# Patient Record
Sex: Female | Born: 1935 | Race: White | Hispanic: No | Marital: Married | Smoking: Never smoker
Health system: Southern US, Community
[De-identification: ages and names within clinical notes are randomized; demographics above are authoritative.]

## PROBLEM LIST (undated history)

## (undated) DIAGNOSIS — R0602 Shortness of breath: Secondary | ICD-10-CM

## (undated) DIAGNOSIS — I493 Ventricular premature depolarization: Secondary | ICD-10-CM

## (undated) DIAGNOSIS — E039 Hypothyroidism, unspecified: Secondary | ICD-10-CM

## (undated) DIAGNOSIS — J449 Chronic obstructive pulmonary disease, unspecified: Secondary | ICD-10-CM

## (undated) DIAGNOSIS — E785 Hyperlipidemia, unspecified: Secondary | ICD-10-CM

## (undated) DIAGNOSIS — Z950 Presence of cardiac pacemaker: Secondary | ICD-10-CM

## (undated) DIAGNOSIS — G8929 Other chronic pain: Secondary | ICD-10-CM

## (undated) DIAGNOSIS — J189 Pneumonia, unspecified organism: Secondary | ICD-10-CM

## (undated) DIAGNOSIS — I441 Atrioventricular block, second degree: Secondary | ICD-10-CM

## (undated) DIAGNOSIS — M1990 Unspecified osteoarthritis, unspecified site: Secondary | ICD-10-CM

## (undated) DIAGNOSIS — Q169 Congenital malformation of ear causing impairment of hearing, unspecified: Secondary | ICD-10-CM

## (undated) DIAGNOSIS — G629 Polyneuropathy, unspecified: Secondary | ICD-10-CM

## (undated) DIAGNOSIS — K469 Unspecified abdominal hernia without obstruction or gangrene: Secondary | ICD-10-CM

## (undated) DIAGNOSIS — M545 Low back pain, unspecified: Secondary | ICD-10-CM

## (undated) DIAGNOSIS — I5042 Chronic combined systolic (congestive) and diastolic (congestive) heart failure: Secondary | ICD-10-CM

## (undated) DIAGNOSIS — I10 Essential (primary) hypertension: Secondary | ICD-10-CM

## (undated) DIAGNOSIS — L97509 Non-pressure chronic ulcer of other part of unspecified foot with unspecified severity: Secondary | ICD-10-CM

## (undated) HISTORY — DX: Ventricular premature depolarization: I49.3

## (undated) HISTORY — PX: REPLACEMENT TOTAL KNEE: SUR1224

## (undated) HISTORY — DX: Unspecified osteoarthritis, unspecified site: M19.90

## (undated) HISTORY — PX: THYROID SURGERY: SHX805

## (undated) HISTORY — DX: Hyperlipidemia, unspecified: E78.5

## (undated) HISTORY — DX: Congenital malformation of ear causing impairment of hearing, unspecified: Q16.9

## (undated) HISTORY — PX: NECK SURGERY: SHX720

## (undated) HISTORY — DX: Hypothyroidism, unspecified: E03.9

## (undated) HISTORY — PX: CATARACT EXTRACTION W/ INTRAOCULAR LENS  IMPLANT, BILATERAL: SHX1307

## (undated) HISTORY — DX: Unspecified abdominal hernia without obstruction or gangrene: K46.9

## (undated) HISTORY — PX: ABDOMINAL HYSTERECTOMY: SHX81

## (undated) HISTORY — DX: Polyneuropathy, unspecified: G62.9

## (undated) HISTORY — PX: JOINT REPLACEMENT: SHX530

## (undated) HISTORY — DX: Essential (primary) hypertension: I10

## (undated) HISTORY — DX: Chronic combined systolic (congestive) and diastolic (congestive) heart failure: I50.42

## (undated) HISTORY — PX: DILATION AND CURETTAGE OF UTERUS: SHX78

## (undated) HISTORY — DX: Atrioventricular block, second degree: I44.1

## (undated) HISTORY — DX: Non-pressure chronic ulcer of other part of unspecified foot with unspecified severity: L97.509

## (undated) HISTORY — DX: Presence of cardiac pacemaker: Z95.0

---
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
## 1997-07-31 ENCOUNTER — Encounter: Payer: Self-pay | Admitting: Family Medicine

## 2002-07-07 ENCOUNTER — Encounter: Payer: Self-pay | Admitting: Family Medicine

## 2003-07-17 ENCOUNTER — Encounter: Payer: Self-pay | Admitting: Family Medicine

## 2004-08-29 ENCOUNTER — Encounter: Payer: Self-pay | Admitting: Family Medicine

## 2005-07-18 ENCOUNTER — Encounter: Payer: Self-pay | Admitting: Family Medicine

## 2006-02-12 ENCOUNTER — Ambulatory Visit: Payer: Self-pay | Admitting: Pain Medicine

## 2006-03-21 ENCOUNTER — Ambulatory Visit: Payer: Self-pay | Admitting: Family Medicine

## 2006-04-04 ENCOUNTER — Ambulatory Visit: Payer: Self-pay | Admitting: Family Medicine

## 2006-04-17 HISTORY — PX: DOPPLER ECHOCARDIOGRAPHY: SHX263

## 2006-06-20 ENCOUNTER — Ambulatory Visit: Payer: Self-pay | Admitting: Family Medicine

## 2006-06-20 LAB — CONVERTED CEMR LAB
ALT: 16 units/L (ref 0–40)
Albumin: 4 g/dL (ref 3.5–5.2)
Alkaline Phosphatase: 57 units/L (ref 39–117)
BUN: 10 mg/dL (ref 6–23)
Calcium: 9.4 mg/dL (ref 8.4–10.5)
GFR calc Af Amer: 103 mL/min
GFR calc non Af Amer: 85 mL/min
Potassium: 4.8 meq/L (ref 3.5–5.1)
Total Protein: 7.1 g/dL (ref 6.0–8.3)

## 2006-07-12 ENCOUNTER — Ambulatory Visit: Payer: Self-pay | Admitting: Family Medicine

## 2006-07-26 ENCOUNTER — Ambulatory Visit: Payer: Self-pay | Admitting: Family Medicine

## 2006-08-31 ENCOUNTER — Encounter: Payer: Self-pay | Admitting: Family Medicine

## 2006-08-31 ENCOUNTER — Ambulatory Visit: Payer: Self-pay | Admitting: Internal Medicine

## 2006-09-03 ENCOUNTER — Ambulatory Visit: Payer: Self-pay | Admitting: Family Medicine

## 2006-09-03 DIAGNOSIS — E039 Hypothyroidism, unspecified: Secondary | ICD-10-CM | POA: Insufficient documentation

## 2006-09-04 LAB — CONVERTED CEMR LAB: TSH: 1.27 microintl units/mL (ref 0.35–5.50)

## 2006-09-07 ENCOUNTER — Encounter: Payer: Self-pay | Admitting: Family Medicine

## 2006-09-17 ENCOUNTER — Ambulatory Visit: Payer: Self-pay | Admitting: Family Medicine

## 2006-09-17 ENCOUNTER — Telehealth: Payer: Self-pay | Admitting: Family Medicine

## 2006-09-17 DIAGNOSIS — J309 Allergic rhinitis, unspecified: Secondary | ICD-10-CM | POA: Insufficient documentation

## 2006-09-17 DIAGNOSIS — G609 Hereditary and idiopathic neuropathy, unspecified: Secondary | ICD-10-CM | POA: Insufficient documentation

## 2006-09-17 DIAGNOSIS — J45909 Unspecified asthma, uncomplicated: Secondary | ICD-10-CM | POA: Insufficient documentation

## 2006-09-25 ENCOUNTER — Telehealth (INDEPENDENT_AMBULATORY_CARE_PROVIDER_SITE_OTHER): Payer: Self-pay | Admitting: *Deleted

## 2006-10-02 ENCOUNTER — Telehealth (INDEPENDENT_AMBULATORY_CARE_PROVIDER_SITE_OTHER): Payer: Self-pay | Admitting: *Deleted

## 2006-12-03 ENCOUNTER — Telehealth: Payer: Self-pay | Admitting: Family Medicine

## 2007-01-25 ENCOUNTER — Ambulatory Visit: Payer: Self-pay | Admitting: Family Medicine

## 2007-01-29 ENCOUNTER — Encounter: Payer: Self-pay | Admitting: Family Medicine

## 2007-01-29 DIAGNOSIS — M1990 Unspecified osteoarthritis, unspecified site: Secondary | ICD-10-CM | POA: Insufficient documentation

## 2007-01-29 DIAGNOSIS — I10 Essential (primary) hypertension: Secondary | ICD-10-CM | POA: Insufficient documentation

## 2007-01-29 DIAGNOSIS — E785 Hyperlipidemia, unspecified: Secondary | ICD-10-CM

## 2007-01-30 ENCOUNTER — Ambulatory Visit: Payer: Self-pay | Admitting: Family Medicine

## 2007-01-30 DIAGNOSIS — I872 Venous insufficiency (chronic) (peripheral): Secondary | ICD-10-CM | POA: Insufficient documentation

## 2007-01-30 DIAGNOSIS — I8310 Varicose veins of unspecified lower extremity with inflammation: Secondary | ICD-10-CM

## 2007-02-06 ENCOUNTER — Ambulatory Visit: Payer: Self-pay | Admitting: Family Medicine

## 2007-02-07 LAB — CONVERTED CEMR LAB
Cholesterol: 120 mg/dL (ref 0–200)
HDL: 65.9 mg/dL (ref >39.0)
Total CHOL/HDL Ratio: 1.8
Triglycerides: 73 mg/dL (ref 0–149)

## 2007-02-14 ENCOUNTER — Telehealth: Payer: Self-pay | Admitting: Family Medicine

## 2007-02-15 ENCOUNTER — Encounter: Payer: Self-pay | Admitting: Family Medicine

## 2007-02-20 ENCOUNTER — Encounter (INDEPENDENT_AMBULATORY_CARE_PROVIDER_SITE_OTHER): Payer: Self-pay | Admitting: *Deleted

## 2007-02-20 ENCOUNTER — Encounter: Payer: Self-pay | Admitting: Family Medicine

## 2007-02-20 ENCOUNTER — Telehealth: Payer: Self-pay | Admitting: Family Medicine

## 2007-03-05 ENCOUNTER — Ambulatory Visit: Payer: Self-pay | Admitting: Family Medicine

## 2007-03-06 ENCOUNTER — Encounter (INDEPENDENT_AMBULATORY_CARE_PROVIDER_SITE_OTHER): Payer: Self-pay | Admitting: *Deleted

## 2007-04-02 ENCOUNTER — Telehealth: Payer: Self-pay | Admitting: Family Medicine

## 2007-04-26 ENCOUNTER — Ambulatory Visit: Payer: Self-pay | Admitting: Family Medicine

## 2007-05-01 ENCOUNTER — Ambulatory Visit: Payer: Self-pay

## 2007-05-01 ENCOUNTER — Encounter: Payer: Self-pay | Admitting: Family Medicine

## 2007-05-03 ENCOUNTER — Ambulatory Visit: Payer: Self-pay | Admitting: Internal Medicine

## 2007-05-09 ENCOUNTER — Ambulatory Visit: Payer: Self-pay | Admitting: Internal Medicine

## 2007-05-09 LAB — CONVERTED CEMR LAB
BUN: 17 mg/dL (ref 6–23)
CO2: 23 meq/L (ref 19–32)
Calcium: 10 mg/dL (ref 8.4–10.5)
Glucose, Bld: 99 mg/dL (ref 70–99)
Sodium: 135 meq/L (ref 135–145)

## 2007-05-14 ENCOUNTER — Ambulatory Visit: Payer: Self-pay | Admitting: Cardiology

## 2007-05-14 LAB — CONVERTED CEMR LAB
BUN: 18 mg/dL (ref 6–23)
CO2: 25 meq/L (ref 19–32)
Calcium: 9.6 mg/dL (ref 8.4–10.5)
Chloride: 98 meq/L (ref 96–112)
Creatinine, Ser: 0.86 mg/dL (ref 0.40–1.20)

## 2007-05-20 ENCOUNTER — Telehealth: Payer: Self-pay | Admitting: Family Medicine

## 2007-05-27 ENCOUNTER — Ambulatory Visit: Payer: Self-pay | Admitting: Internal Medicine

## 2007-05-27 LAB — CONVERTED CEMR LAB
BUN: 14 mg/dL (ref 6–23)
Calcium: 9.7 mg/dL (ref 8.4–10.5)
Glucose, Bld: 97 mg/dL (ref 70–99)
Pro B Natriuretic peptide (BNP): 638 pg/mL — ABNORMAL HIGH (ref 0.0–100.0)
Sodium: 136 meq/L (ref 135–145)

## 2007-06-20 ENCOUNTER — Telehealth: Payer: Self-pay | Admitting: Family Medicine

## 2007-06-27 ENCOUNTER — Ambulatory Visit: Payer: Self-pay | Admitting: Internal Medicine

## 2007-06-27 LAB — CONVERTED CEMR LAB
Calcium: 9.6 mg/dL (ref 8.4–10.5)
Creatinine, Ser: 1 mg/dL (ref 0.40–1.20)

## 2007-07-11 ENCOUNTER — Telehealth: Payer: Self-pay | Admitting: Family Medicine

## 2007-07-23 ENCOUNTER — Encounter: Payer: Self-pay | Admitting: Family Medicine

## 2007-07-30 ENCOUNTER — Telehealth: Payer: Self-pay | Admitting: Family Medicine

## 2007-07-31 ENCOUNTER — Telehealth: Payer: Self-pay | Admitting: Family Medicine

## 2007-08-07 ENCOUNTER — Telehealth: Payer: Self-pay | Admitting: Family Medicine

## 2007-08-21 ENCOUNTER — Ambulatory Visit: Payer: Self-pay | Admitting: Internal Medicine

## 2007-08-21 LAB — CONVERTED CEMR LAB
Magnesium: 2 mg/dL (ref 1.5–2.5)
Potassium: 4.5 meq/L (ref 3.5–5.3)
Pro B Natriuretic peptide (BNP): 695 pg/mL — ABNORMAL HIGH (ref 0.0–100.0)
Sodium: 138 meq/L (ref 135–145)
TSH: 0.407 microintl units/mL (ref 0.350–5.50)

## 2007-08-26 ENCOUNTER — Ambulatory Visit: Payer: Self-pay | Admitting: Cardiology

## 2007-09-23 ENCOUNTER — Ambulatory Visit: Payer: Self-pay | Admitting: Internal Medicine

## 2007-09-24 ENCOUNTER — Encounter: Payer: Self-pay | Admitting: Internal Medicine

## 2007-09-24 LAB — CONVERTED CEMR LAB
BUN: 16 mg/dL (ref 6–23)
INR: 1.1 (ref 0.0–1.5)
Potassium: 4.5 meq/L (ref 3.5–5.3)
Prothrombin Time: 14.4 s (ref 11.6–15.2)
RBC: 4.21 M/uL (ref 3.87–5.11)
Sodium: 128 meq/L — ABNORMAL LOW (ref 135–145)
WBC: 5 10*3/uL (ref 4.0–10.5)

## 2007-09-27 ENCOUNTER — Inpatient Hospital Stay (HOSPITAL_BASED_OUTPATIENT_CLINIC_OR_DEPARTMENT_OTHER): Admission: RE | Admit: 2007-09-27 | Discharge: 2007-09-27 | Payer: Self-pay | Admitting: Internal Medicine

## 2007-09-27 ENCOUNTER — Ambulatory Visit: Payer: Self-pay | Admitting: Internal Medicine

## 2007-10-09 ENCOUNTER — Ambulatory Visit: Payer: Self-pay | Admitting: Internal Medicine

## 2007-11-01 ENCOUNTER — Telehealth: Payer: Self-pay | Admitting: Family Medicine

## 2007-11-06 ENCOUNTER — Telehealth: Payer: Self-pay | Admitting: Family Medicine

## 2007-11-08 ENCOUNTER — Telehealth: Payer: Self-pay | Admitting: Family Medicine

## 2007-11-18 ENCOUNTER — Ambulatory Visit: Payer: Self-pay | Admitting: Internal Medicine

## 2007-11-18 LAB — CONVERTED CEMR LAB
Albumin: 4.6 g/dL (ref 3.5–5.2)
Alkaline Phosphatase: 58 units/L (ref 39–117)
CO2: 24 meq/L (ref 19–32)
Free T4: 2.22 ng/dL — ABNORMAL HIGH (ref 0.89–1.80)
Glucose, Bld: 89 mg/dL (ref 70–99)
Potassium: 5.1 meq/L (ref 3.5–5.3)
Sodium: 134 meq/L — ABNORMAL LOW (ref 135–145)
Total Protein: 7.5 g/dL (ref 6.0–8.3)

## 2007-11-22 ENCOUNTER — Ambulatory Visit: Payer: Self-pay | Admitting: Internal Medicine

## 2007-11-28 ENCOUNTER — Encounter: Payer: Self-pay | Admitting: Family Medicine

## 2007-11-29 ENCOUNTER — Encounter: Payer: Self-pay | Admitting: Family Medicine

## 2007-12-02 ENCOUNTER — Ambulatory Visit: Payer: Self-pay | Admitting: Family Medicine

## 2007-12-02 DIAGNOSIS — L03119 Cellulitis of unspecified part of limb: Secondary | ICD-10-CM

## 2007-12-02 DIAGNOSIS — L02619 Cutaneous abscess of unspecified foot: Secondary | ICD-10-CM

## 2007-12-03 ENCOUNTER — Telehealth: Payer: Self-pay | Admitting: Family Medicine

## 2007-12-03 ENCOUNTER — Telehealth (INDEPENDENT_AMBULATORY_CARE_PROVIDER_SITE_OTHER): Payer: Self-pay | Admitting: Internal Medicine

## 2007-12-05 ENCOUNTER — Ambulatory Visit: Payer: Self-pay | Admitting: Family Medicine

## 2007-12-05 DIAGNOSIS — F29 Unspecified psychosis not due to a substance or known physiological condition: Secondary | ICD-10-CM | POA: Insufficient documentation

## 2007-12-13 ENCOUNTER — Ambulatory Visit: Payer: Self-pay | Admitting: Family Medicine

## 2007-12-13 DIAGNOSIS — F068 Other specified mental disorders due to known physiological condition: Secondary | ICD-10-CM | POA: Insufficient documentation

## 2007-12-13 DIAGNOSIS — R4182 Altered mental status, unspecified: Secondary | ICD-10-CM

## 2007-12-18 ENCOUNTER — Ambulatory Visit: Payer: Self-pay

## 2007-12-18 ENCOUNTER — Ambulatory Visit: Payer: Self-pay | Admitting: Internal Medicine

## 2007-12-18 ENCOUNTER — Encounter: Payer: Self-pay | Admitting: Internal Medicine

## 2007-12-18 LAB — CONVERTED CEMR LAB
BUN: 21 mg/dL (ref 6–23)
CO2: 23 meq/L (ref 19–32)
Calcium: 9.2 mg/dL (ref 8.4–10.5)
Creatinine, Ser: 0.95 mg/dL (ref 0.40–1.20)
Glucose, Bld: 86 mg/dL (ref 70–99)

## 2007-12-19 ENCOUNTER — Ambulatory Visit: Payer: Self-pay | Admitting: Internal Medicine

## 2007-12-24 ENCOUNTER — Telehealth: Payer: Self-pay | Admitting: Family Medicine

## 2008-01-15 ENCOUNTER — Ambulatory Visit: Payer: Self-pay | Admitting: Internal Medicine

## 2008-02-17 ENCOUNTER — Telehealth: Payer: Self-pay | Admitting: Family Medicine

## 2008-03-02 ENCOUNTER — Telehealth: Payer: Self-pay | Admitting: Family Medicine

## 2008-03-03 ENCOUNTER — Telehealth: Payer: Self-pay | Admitting: Family Medicine

## 2008-03-16 ENCOUNTER — Telehealth: Payer: Self-pay | Admitting: Family Medicine

## 2008-03-19 ENCOUNTER — Ambulatory Visit: Payer: Self-pay | Admitting: Internal Medicine

## 2008-03-26 ENCOUNTER — Ambulatory Visit: Payer: Self-pay | Admitting: Family Medicine

## 2008-04-02 ENCOUNTER — Encounter: Payer: Self-pay | Admitting: Family Medicine

## 2008-04-07 ENCOUNTER — Encounter (INDEPENDENT_AMBULATORY_CARE_PROVIDER_SITE_OTHER): Payer: Self-pay | Admitting: *Deleted

## 2008-04-15 ENCOUNTER — Telehealth: Payer: Self-pay | Admitting: Family Medicine

## 2008-04-23 ENCOUNTER — Ambulatory Visit: Payer: Self-pay | Admitting: Family Medicine

## 2008-04-24 ENCOUNTER — Ambulatory Visit: Payer: Self-pay | Admitting: Family Medicine

## 2008-04-24 LAB — CONVERTED CEMR LAB: OCCULT 1: NEGATIVE

## 2008-04-27 ENCOUNTER — Encounter (INDEPENDENT_AMBULATORY_CARE_PROVIDER_SITE_OTHER): Payer: Self-pay | Admitting: *Deleted

## 2008-05-08 ENCOUNTER — Telehealth: Payer: Self-pay | Admitting: Family Medicine

## 2008-05-12 ENCOUNTER — Telehealth: Payer: Self-pay | Admitting: Family Medicine

## 2008-06-01 ENCOUNTER — Telehealth: Payer: Self-pay | Admitting: Family Medicine

## 2008-06-11 ENCOUNTER — Ambulatory Visit: Payer: Self-pay

## 2008-06-11 ENCOUNTER — Encounter: Payer: Self-pay | Admitting: Internal Medicine

## 2008-06-11 ENCOUNTER — Telehealth: Payer: Self-pay | Admitting: Family Medicine

## 2008-06-16 ENCOUNTER — Telehealth: Payer: Self-pay | Admitting: Family Medicine

## 2008-06-19 ENCOUNTER — Encounter: Payer: Self-pay | Admitting: Internal Medicine

## 2008-06-19 ENCOUNTER — Ambulatory Visit: Payer: Self-pay | Admitting: Internal Medicine

## 2008-06-19 DIAGNOSIS — I5032 Chronic diastolic (congestive) heart failure: Secondary | ICD-10-CM

## 2008-06-30 ENCOUNTER — Telehealth: Payer: Self-pay | Admitting: Family Medicine

## 2008-07-07 ENCOUNTER — Ambulatory Visit: Payer: Self-pay | Admitting: Internal Medicine

## 2008-07-07 ENCOUNTER — Encounter: Payer: Self-pay | Admitting: Internal Medicine

## 2008-07-14 ENCOUNTER — Ambulatory Visit: Payer: Self-pay | Admitting: Cardiovascular Disease

## 2008-07-14 LAB — CONVERTED CEMR LAB
BUN: 16 mg/dL (ref 6–23)
CO2: 23 meq/L (ref 19–32)
Chloride: 99 meq/L (ref 96–112)
Glucose, Bld: 101 mg/dL — ABNORMAL HIGH (ref 70–99)
Potassium: 4.1 meq/L (ref 3.5–5.3)

## 2008-07-21 ENCOUNTER — Encounter: Payer: Self-pay | Admitting: Family Medicine

## 2008-07-27 ENCOUNTER — Telehealth: Payer: Self-pay | Admitting: Family Medicine

## 2008-07-28 ENCOUNTER — Telehealth: Payer: Self-pay | Admitting: Family Medicine

## 2008-08-03 ENCOUNTER — Telehealth: Payer: Self-pay | Admitting: Family Medicine

## 2008-08-06 ENCOUNTER — Ambulatory Visit: Payer: Self-pay | Admitting: Internal Medicine

## 2008-08-06 ENCOUNTER — Encounter: Payer: Self-pay | Admitting: Internal Medicine

## 2008-09-03 ENCOUNTER — Encounter: Payer: Self-pay | Admitting: Family Medicine

## 2008-09-03 ENCOUNTER — Telehealth: Payer: Self-pay | Admitting: Internal Medicine

## 2008-09-03 ENCOUNTER — Ambulatory Visit: Payer: Self-pay | Admitting: Podiatry

## 2008-09-15 ENCOUNTER — Ambulatory Visit: Payer: Self-pay | Admitting: Podiatry

## 2008-09-17 ENCOUNTER — Encounter: Payer: Self-pay | Admitting: Family Medicine

## 2008-09-17 ENCOUNTER — Inpatient Hospital Stay: Payer: Self-pay | Admitting: Podiatry

## 2008-10-08 ENCOUNTER — Ambulatory Visit: Payer: Self-pay | Admitting: Cardiology

## 2008-10-08 ENCOUNTER — Inpatient Hospital Stay: Payer: Self-pay | Admitting: Internal Medicine

## 2008-10-16 ENCOUNTER — Encounter: Payer: Self-pay | Admitting: Family Medicine

## 2008-11-06 ENCOUNTER — Ambulatory Visit: Payer: Self-pay | Admitting: Internal Medicine

## 2008-11-11 LAB — CONVERTED CEMR LAB
BUN: 28 mg/dL — ABNORMAL HIGH (ref 6–23)
CO2: 22 meq/L (ref 19–32)
Calcium: 9 mg/dL (ref 8.4–10.5)
Chloride: 102 meq/L (ref 96–112)
Creatinine, Ser: 1.34 mg/dL — ABNORMAL HIGH (ref 0.40–1.20)
Glucose, Bld: 88 mg/dL (ref 70–99)
Potassium: 5.1 meq/L (ref 3.5–5.3)
Pro B Natriuretic peptide (BNP): 110.2 pg/mL — ABNORMAL HIGH (ref 0.0–100.0)
Sodium: 136 meq/L (ref 135–145)

## 2008-11-30 ENCOUNTER — Telehealth: Payer: Self-pay | Admitting: Family Medicine

## 2008-12-17 ENCOUNTER — Telehealth: Payer: Self-pay | Admitting: Internal Medicine

## 2008-12-23 ENCOUNTER — Telehealth: Payer: Self-pay | Admitting: Internal Medicine

## 2008-12-24 ENCOUNTER — Ambulatory Visit: Payer: Self-pay | Admitting: Internal Medicine

## 2008-12-31 LAB — CONVERTED CEMR LAB
ALT: 12 units/L (ref 0–35)
AST: 23 units/L (ref 0–37)
Albumin: 4.1 g/dL (ref 3.5–5.2)
Alkaline Phosphatase: 56 units/L (ref 39–117)
Calcium: 9 mg/dL (ref 8.4–10.5)
Chloride: 93 meq/L — ABNORMAL LOW (ref 96–112)
Platelets: 204 10*3/uL (ref 150–400)
Potassium: 4.7 meq/L (ref 3.5–5.3)
RDW: 15.9 % — ABNORMAL HIGH (ref 11.5–15.5)

## 2009-01-04 ENCOUNTER — Ambulatory Visit: Payer: Self-pay | Admitting: Cardiology

## 2009-01-06 LAB — CONVERTED CEMR LAB
BUN: 38 mg/dL — ABNORMAL HIGH (ref 6–23)
Chloride: 95 meq/L — ABNORMAL LOW (ref 96–112)
Creatinine, Ser: 1.38 mg/dL — ABNORMAL HIGH (ref 0.40–1.20)
Glucose, Bld: 85 mg/dL (ref 70–99)
Pro B Natriuretic peptide (BNP): 343.5 pg/mL — ABNORMAL HIGH (ref 0.0–100.0)

## 2009-01-11 ENCOUNTER — Encounter: Payer: Self-pay | Admitting: Internal Medicine

## 2009-01-11 ENCOUNTER — Encounter: Payer: Self-pay | Admitting: Cardiology

## 2009-01-13 ENCOUNTER — Ambulatory Visit: Payer: Self-pay | Admitting: Internal Medicine

## 2009-01-21 ENCOUNTER — Telehealth: Payer: Self-pay | Admitting: Family Medicine

## 2009-01-21 ENCOUNTER — Encounter: Payer: Self-pay | Admitting: Internal Medicine

## 2009-02-03 ENCOUNTER — Encounter: Payer: Self-pay | Admitting: Internal Medicine

## 2009-02-17 ENCOUNTER — Ambulatory Visit: Payer: Self-pay | Admitting: Internal Medicine

## 2009-02-17 DIAGNOSIS — I4430 Unspecified atrioventricular block: Secondary | ICD-10-CM

## 2009-03-15 ENCOUNTER — Ambulatory Visit: Payer: Self-pay | Admitting: Internal Medicine

## 2009-03-15 ENCOUNTER — Encounter: Payer: Self-pay | Admitting: Internal Medicine

## 2009-03-15 DIAGNOSIS — I428 Other cardiomyopathies: Secondary | ICD-10-CM | POA: Insufficient documentation

## 2009-03-30 ENCOUNTER — Telehealth: Payer: Self-pay | Admitting: Internal Medicine

## 2009-04-20 ENCOUNTER — Ambulatory Visit: Payer: Self-pay | Admitting: Internal Medicine

## 2009-06-25 ENCOUNTER — Ambulatory Visit: Payer: Self-pay | Admitting: Internal Medicine

## 2009-08-10 ENCOUNTER — Ambulatory Visit: Payer: Self-pay | Admitting: Internal Medicine

## 2009-08-18 ENCOUNTER — Ambulatory Visit: Payer: Self-pay | Admitting: Internal Medicine

## 2009-08-25 ENCOUNTER — Ambulatory Visit: Payer: Self-pay | Admitting: Internal Medicine

## 2009-10-04 ENCOUNTER — Inpatient Hospital Stay: Payer: Self-pay | Admitting: Surgery

## 2009-10-04 ENCOUNTER — Ambulatory Visit: Payer: Self-pay | Admitting: Internal Medicine

## 2009-10-09 ENCOUNTER — Encounter: Payer: Self-pay | Admitting: Internal Medicine

## 2010-01-25 IMAGING — CT CT HEAD WITHOUT CONTRAST
2 series · 15 of 30 positions shown, 19 images · non-contrast
Comparison: none

REASON FOR EXAM: aphasia
COMMENTS:   LMP: Post-Menopausal

PROCEDURE:     CT  - CT HEAD WITHOUT CONTRAST  - October 08, 2008 [DATE]
RESULT:     Comparison:  None
TECHNIQUE: Multiple axial images from the foramen magnum to the vertex were
obtained without IV contrast.

[Series 2: without · axial · non-contrast · 0.41mm/px · z∈[+820,+950]mm · 13 of 36 slices shown, 17 images]
[im 3/36  brain]
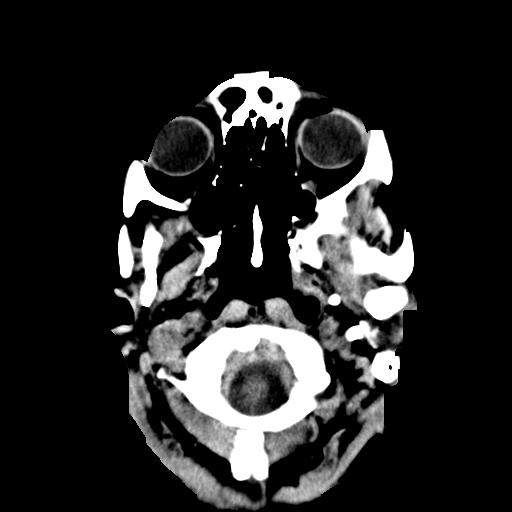
[im 3/36  bone]
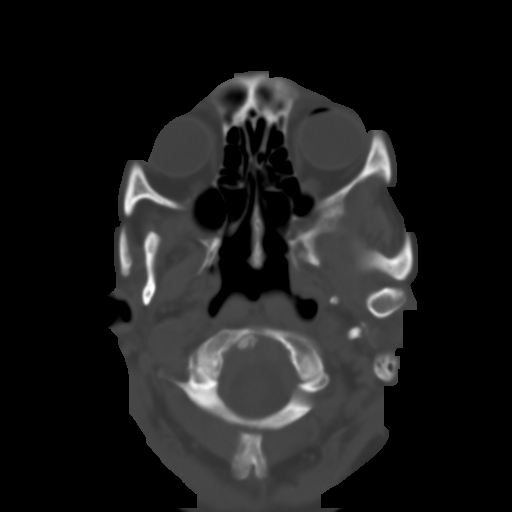
[im 6/36  brain]
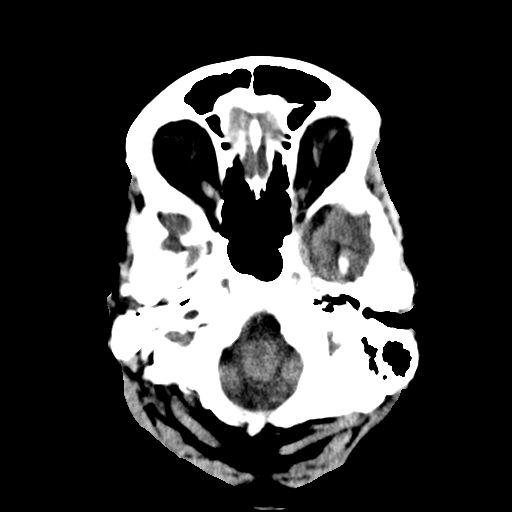
[im 8/36  brain]
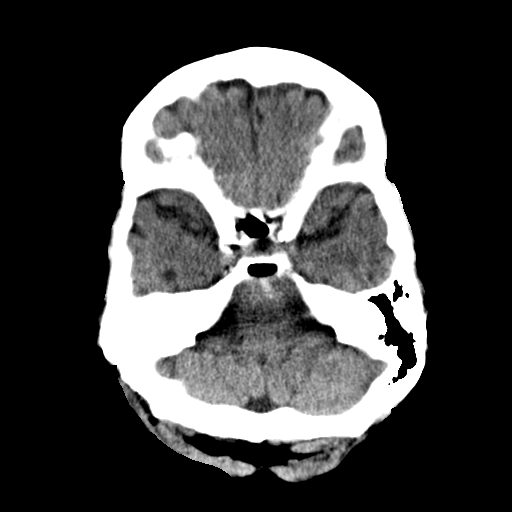
[im 11/36  brain]
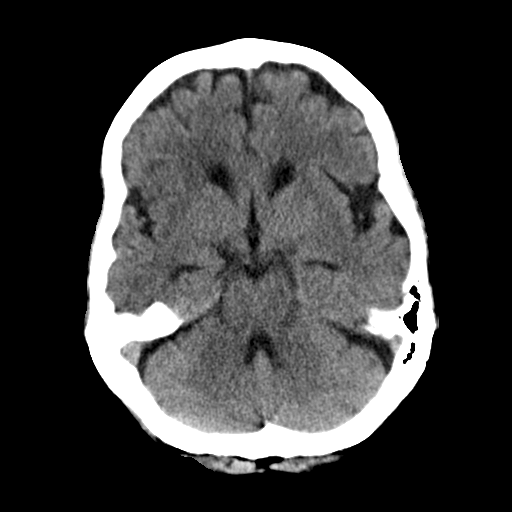
[im 13/36  brain]
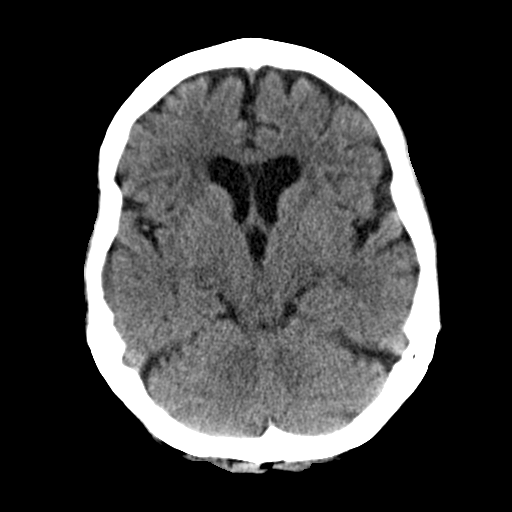
[im 13/36  bone]
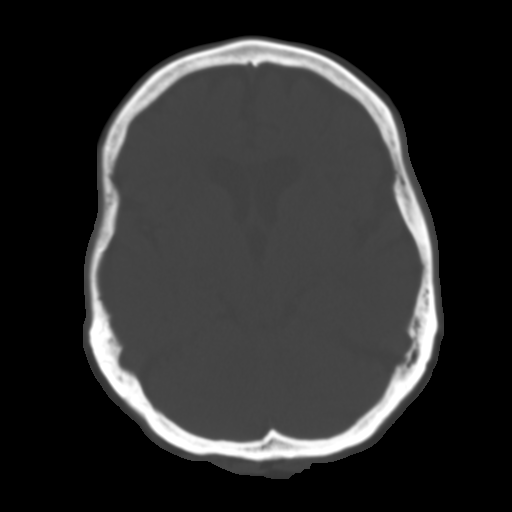
[im 16/36  brain]
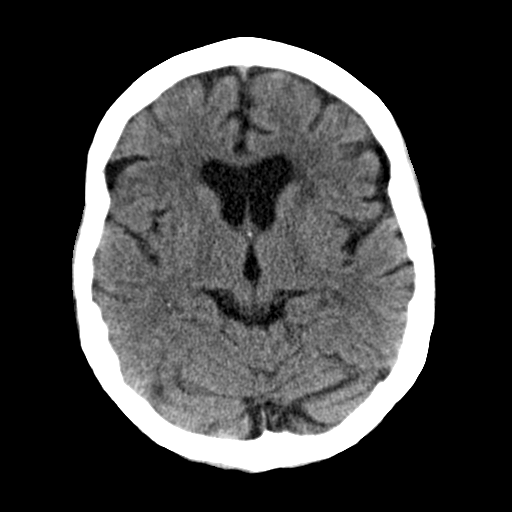
[im 18/36  brain]
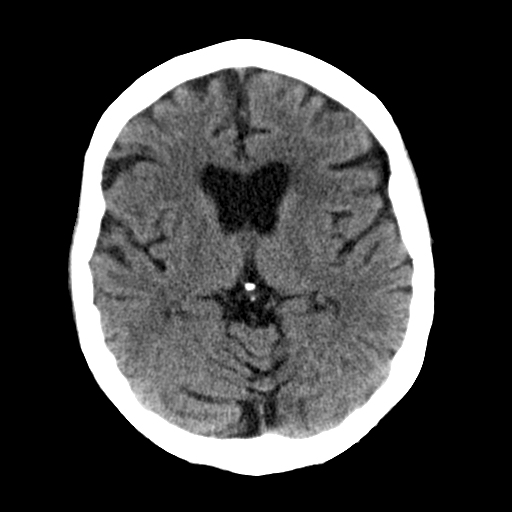
[im 21/36  brain]
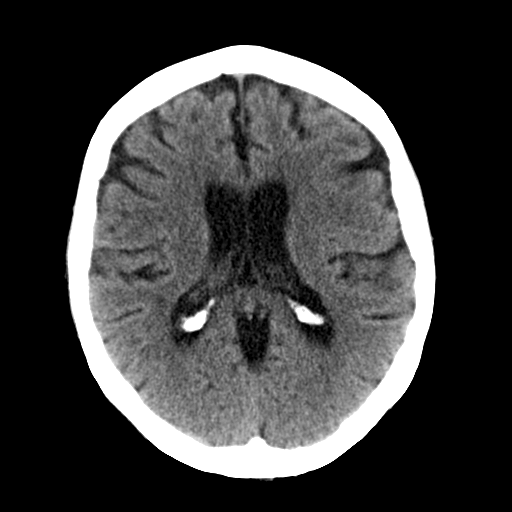
[im 23/36  brain]
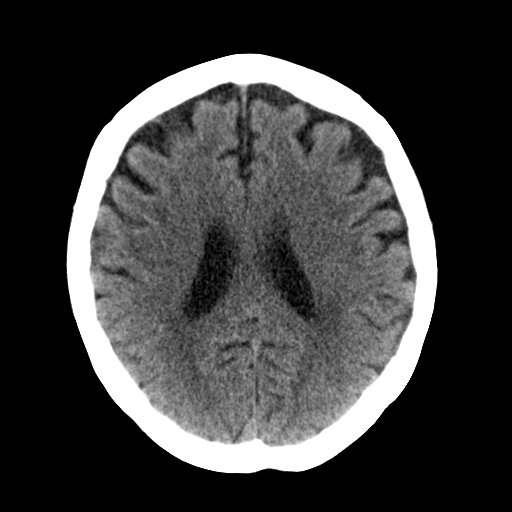
[im 23/36  bone]
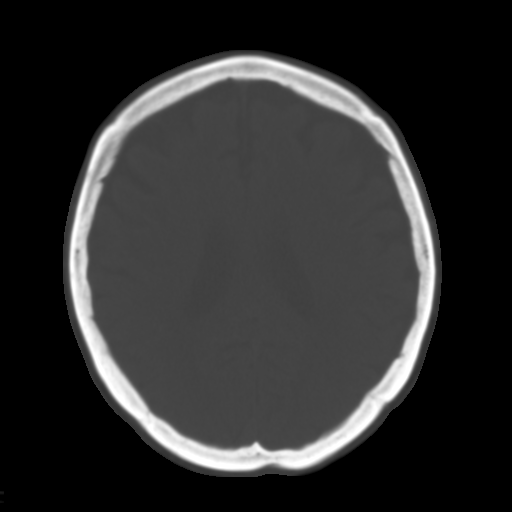
[im 26/36  brain]
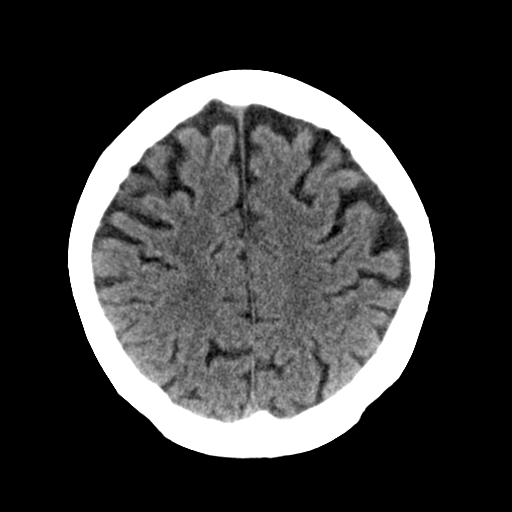
[im 28/36  brain]
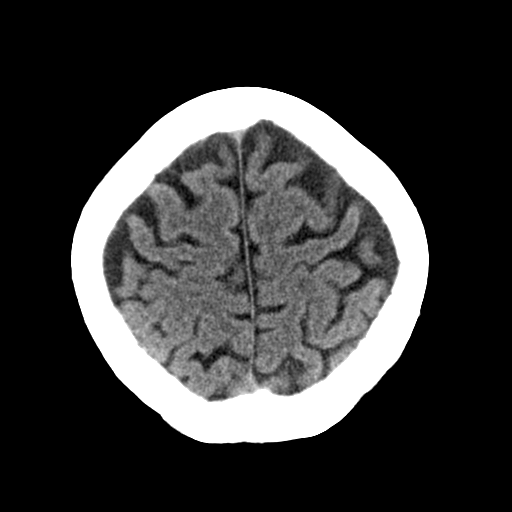
[im 31/36  brain]
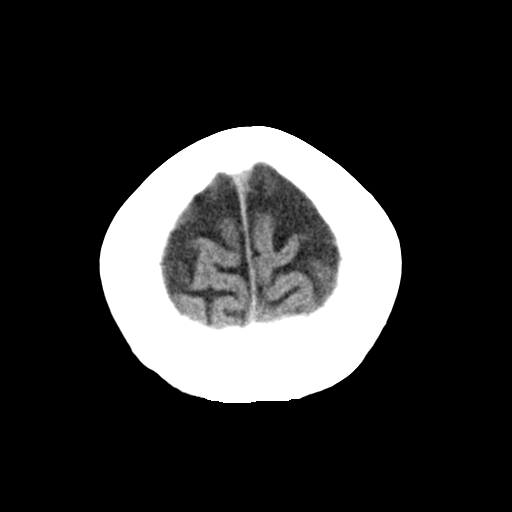
[im 33/36  brain]
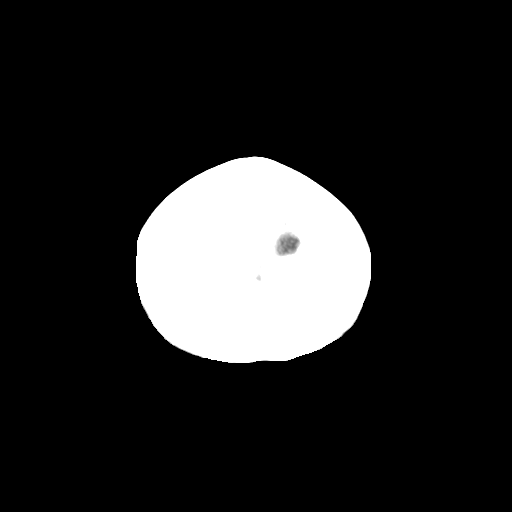
[im 33/36  bone]
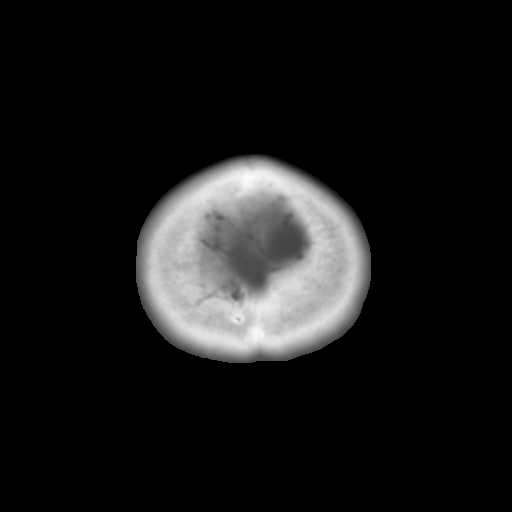

[Series 3: bone · axial · 0.41mm/px · z∈[+820,+844]mm · 2 of 36 slices shown]
[im 3/36  bone]
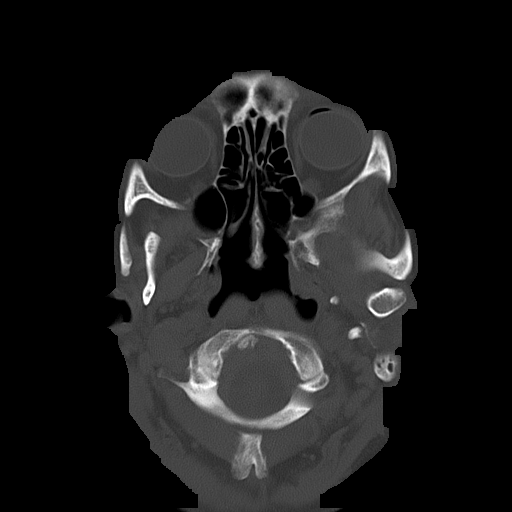
[im 8/36  bone]
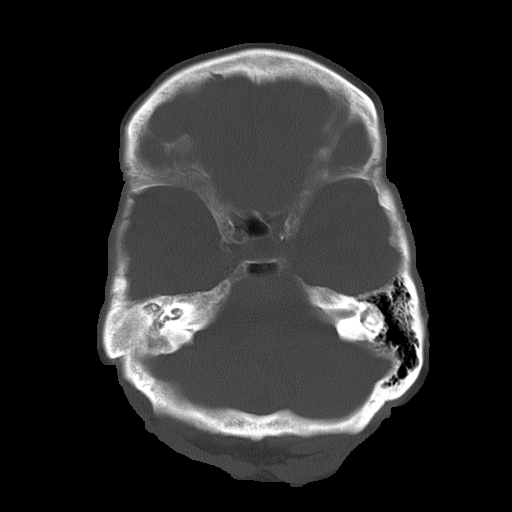

[15 of 30 positions shown; findings below may reference images not displayed]

FINDINGS: There is no evidence of mass effect, midline shift, or extra-axial fluid
collections.  There is no evidence of a space-occupying lesion or
intracranial hemorrhage. There is no evidence of a cortical-based area of
acute infarction. There is generalized cerebral atrophy. There is
periventricular white matter low attenuation likely secondary to
microangiopathy.

The ventricles and sulci are appropriate for the patient's age. The basal
cisterns are patent.

Visualized portions of the orbits are unremarkable. The paranasal sinuses
and mastoid air cells are unremarkable.

The osseous structures are unremarkable.
IMPRESSION: No acute intracranial process.

## 2010-01-26 IMAGING — NM NM LUNG SCAN
2 series · 16 of 16 positions shown · non-contrast
Comparison: none

REASON FOR EXAM: shortness of breath - r/o PE
COMMENTS:

[Series 1000: lung ventilation · 3.30mm/px · 4 acquisitions, 8 frames shown]
[im 1/4]
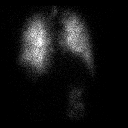
[im 1/4]
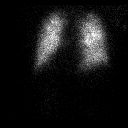
[im 2/4]
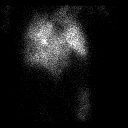
[im 2/4]
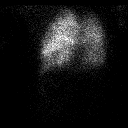
[im 3/4  full-range]
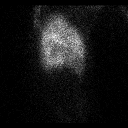
[im 3/4  full-range]
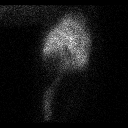
[im 4/4]
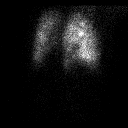
[im 4/4]
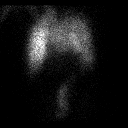

[Series 1000: lung perfusion · 1.65mm/px · 4 acquisitions, 8 frames shown]
[im 1/4]
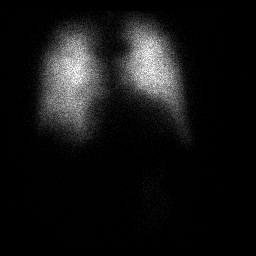
[im 1/4]
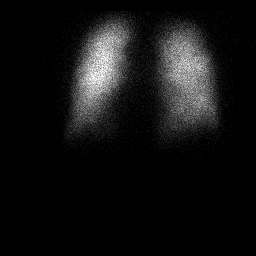
[im 2/4]
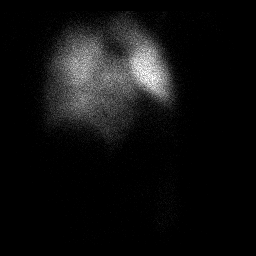
[im 2/4]
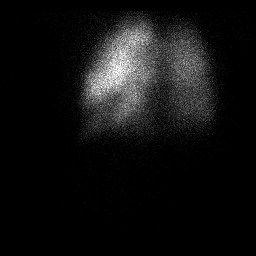
[im 3/4  full-range]
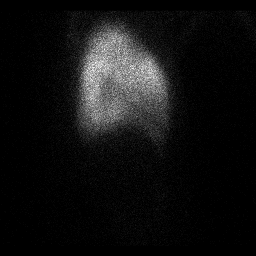
[im 3/4  full-range]
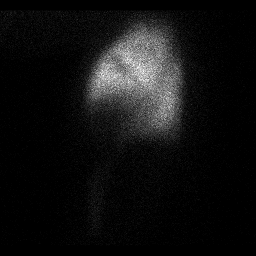
[im 4/4]
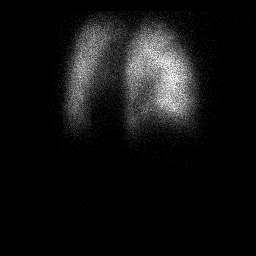
[im 4/4]
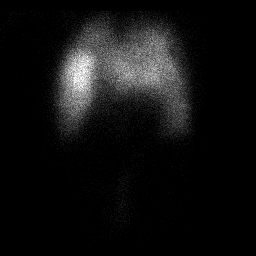

[16 of 16 positions shown; findings below may reference images not displayed]

PROCEDURE:     NM  - NM VQ LUNG SCAN  - [DATE] [DATE] [DATE]  [DATE]

RESULT:     Following administration of 42.3 mCi of technetium-00m DTPA for
ventilation and 4.26 mCi of technetium-00m MAA for perfusion, ventilation
perfusion scan was obtained.  Comparison is made to a prior chest x-ray of
10-08-08.  No definite ventilation perfusion mismatches are noted. Matched
ventilation perfusion images are noted. Chest x-ray on 10-08-2008 reveals
changes of congestive heart failure and pulmonary edema.  These together are
most consistent with congestive heart failure and pulmonary edema as opposed
to pulmonary embolus; however, technically this is an indeterminate scan
given the matched lesions.
IMPRESSION: 1. Indeterminate for pulmonary embolus, see complete discussion above.

## 2010-05-17 NOTE — Consult Note (Signed)
Summary: [facility] CONSULT  [facility] CONSULT   Imported By: Park Breed 10/11/2009 09:14:01  _____________________________________________________________________  External Attachment:    Type:   Image     Comment:   External Document

## 2010-05-17 NOTE — Assessment & Plan Note (Signed)
Summary: f36m   Visit Type:  Follow-up Referring Provider:  Arvilla Meres Primary Provider:  Duncan Dull  CC:  Follow-up.  History of Present Illness: [name]  is an 77 year old woman with history of congestive heart failure secondary to nonischemic cardiomyopathy with an EF 25% and severe peripheral neuropathy.She underewent Cath 2009 demonstrating nonobsructive disease.  She also has frequent PVCs and had been placed  on amiodarone in the hopes that her ejection fraction might improve. Initiailly her EF improved to about 40% but this was tranisient with subsequent worseing and with the development of tremor attributed tothe amio, it was stopped .She has had effective bradycardia prob related to her PVCs and the Betablockers have been discontinued;  she has had no palpitations  She saw Dr. Peggyann Shoals a  month ago who had long talk with about severeal issues including possible catheter ablation of PVCs, CRT and prophylactic ICD. The decision was made to continue with medical rx, which I feel is the right decsion.  Overall doing farily well, has good days and bad days with fatigue. No chest pain. Mild chronic dyspnea. No orthopnea or PND. No syncope/presyncope. Recently had some squamous cell skin CAs removed from LLE.   Medications Prior to Update: 1)  Bayer Childrens Aspirin 81 Mg Chew (Aspirin) .... Take 1 Tablet By Mouth Once A Day 2)  Lunesta 3 Mg Tabs (Eszopiclone) .Marland Kitchen.. 1 Tab By Mouth At Bedtime As Needed Insomnia 3)  Fentanyl 100 Mcg/hr Pt72 (Fentanyl) .... Apply 1 Patch To Skin Every 48 Hours  Diagnosis 365.9 Peripheral Neuropathy 4)  Synthroid 200 Mcg Tabs (Levothyroxine Sodium) .Marland Kitchen.. 1 By Mouth Once Daily 5)  Fosinopril Sodium 40 Mg Tabs (Fosinopril Sodium) .... 1/2  Tablet By Mouth Once A Day 6)  Lyrica 100 Mg Caps (Pregabalin) .Marland Kitchen.. 1 By Mouth Once Daily 7)  Multivitamins  Tabs (Multiple Vitamin) .Marland Kitchen.. 1 By Mouth Daily 8)  Loratadine 10 Mg Tabs (Loratadine) .Marland Kitchen.. 1 By Mouth  Daily 9)  Advair Diskus 250-50 Mcg/dose Aepb (Fluticasone-Salmeterol) .... 2 Puffs Two Times A Day 10)  Pravachol 80 Mg  Tabs (Pravastatin Sodium) .Marland Kitchen.. 1 By Mouth Qd 11)  Furosemide 80 Mg  Tabs (Furosemide) .... Take 1 Tablet By Mouth Once A Day 12)  Flunisolide 29 Mcg/act  Soln (Flunisolide) .... 2 Spray Per Nostril or Back As Needed 13)  Proair Hfa 108 (90 Base) Mcg/act  Aers (Albuterol Sulfate) .... 2 Puffs Inh Q 4 Hours As Needed Wheeze 14)  Vitamin B-12 500 Mcg Tabs (Cyanocobalamin) .Marland Kitchen.. 1 By Mouth Qd 15)  Senna Concentrate 8.6 Mg Tabs (Sennosides) .... 2 By Mouth Daily 16)  Docusate Sodium 100 Mg Tabs (Docusate Sodium) .... 2 By Mouth Once Daily 17)  Hydrocodone-Acetaminophen 10-650 Mg Tabs (Hydrocodone-Acetaminophen) .... As Needed 18)  Calcium Carbonate-Vitamin D 600-400 Mg-Unit  Tabs (Calcium Carbonate-Vitamin D) .... Daily 19)  Amitriptyline Hcl 25 Mg Tabs (Amitriptyline Hcl) .Marland Kitchen.. 1 By Mouth At Bedtime 20)  Keppra Xr 750 Mg Xr24h-Tab (Levetiracetam) .Marland Kitchen.. 1 By Mouth At Bedtime  Current Medications (verified): 1)  Bayer Childrens Aspirin 81 Mg Chew (Aspirin) .... Take 1 Tablet By Mouth Once A Day 2)  Lunesta 3 Mg Tabs (Eszopiclone) .Marland Kitchen.. 1 Tab By Mouth At Bedtime As Needed Insomnia 3)  Fentanyl 100 Mcg/hr Pt72 (Fentanyl) .... Apply 1 Patch To Skin Every 48 Hours  Diagnosis 365.9 Peripheral Neuropathy 4)  Synthroid 200 Mcg Tabs (Levothyroxine Sodium) .Marland Kitchen.. 1 By Mouth Once Daily 5)  Fosinopril Sodium 40 Mg Tabs (Fosinopril Sodium) .Marland KitchenMarland KitchenMarland Kitchen  1/2  Tablet By Mouth Once A Day 6)  Lyrica 100 Mg Caps (Pregabalin) .Marland Kitchen.. 1 By Mouth Twice Daily 7)  Multivitamins  Tabs (Multiple Vitamin) .Marland Kitchen.. 1 By Mouth Daily 8)  Loratadine 10 Mg Tabs (Loratadine) .Marland Kitchen.. 1 By Mouth Daily 9)  Advair Diskus 250-50 Mcg/dose Aepb (Fluticasone-Salmeterol) .... 2 Puffs Two Times A Day 10)  Pravachol 80 Mg  Tabs (Pravastatin Sodium) .Marland Kitchen.. 1 By Mouth Qd 11)  Furosemide 80 Mg  Tabs (Furosemide) .... Take 1 Tablet By Mouth Once A  Day 12)  Flunisolide 29 Mcg/act  Soln (Flunisolide) .... 2 Spray Per Nostril or Back As Needed 13)  Proair Hfa 108 (90 Base) Mcg/act  Aers (Albuterol Sulfate) .... 2 Puffs Inh Q 4 Hours As Needed Wheeze 14)  Vitamin B-12 500 Mcg Tabs (Cyanocobalamin) .Marland Kitchen.. 1 By Mouth Qd 15)  Senna Concentrate 8.6 Mg Tabs (Sennosides) .... 2 By Mouth Daily 16)  Docusate Sodium 100 Mg Tabs (Docusate Sodium) .... 2 By Mouth Once Daily 17)  Hydrocodone-Acetaminophen 10-650 Mg Tabs (Hydrocodone-Acetaminophen) .... As Needed 18)  Calcium Carbonate-Vitamin D 600-400 Mg-Unit  Tabs (Calcium Carbonate-Vitamin D) .... Daily 19)  Amitriptyline Hcl 25 Mg Tabs (Amitriptyline Hcl) .Marland Kitchen.. 1 By Mouth At Bedtime 20)  Keppra Xr 750 Mg Xr24h-Tab (Levetiracetam) .Marland Kitchen.. 1 By Mouth At Bedtime  Allergies (verified): 1)  ! Levaquin  Past History:  Past Medical History: Last updated: 11/06/2008  1. Congestive heart failure due to nonischemic CM (?PVC-related)      a. EF 20-30% by ECHO 2/10. mild MR (echo 9/09 35-40%)      b. minimal non-obstructive CAD by cath 6/09  2. Frequent PVCs     a. previously on amiodarone to suppress. limited by tremors  3. Hypertension  4. Hyperlipidemia  5. Hypothyroidism  6. Asthma  7. Allergic rhinitis  8. Osteoarthritis  9. Congenital absence of right ear 10. Peripheral neuropathy      a. unknown etiology. Wears Fentanyl patch 11. h/o ETOH abuse, quit a long time ago 12. R foot ulcer  Review of Systems       As per HPI and past medical history; otherwise all systems negative.   Vital Signs:  Patient profile:   77 year old female Height:      69 inches Weight:      180 pounds Pulse rate:   79 / minute Pulse rhythm:   irregular BP sitting:   110 / 62  (right arm) Cuff size:   large  Vitals Entered By: Charlena Cross, RN, BSN (April 20, 2009 2:27 PM)  Physical Exam  General:   Gen: well appearing. 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 JVP flat. Carotids 2+ bilat; no bruits. No lymphadenopathy or thryomegaly appreciated. Cor: PMI nondisplaced. Irregular rhythm, No rubs, gallops, murmur. Lungs: clear Abdomen: soft, nontender, nodistension. No hepatosplenomegaly. No bruits or masses. Good bowel sounds. Extremities: no cyanosis, clubbing, rash,  no edema. multiple surgical scars  Neuro: alert & orientedx3, cranial nerves grossly intact. moves all 4 extremities w/o difficulty. affect pleasant    Impression & Recommendations:  Problem # 1:  SYSTOLIC HEART FAILURE, CHRONIC (ICD-428.22) Chronic NYHA class 3. Volume status looks good. Will restart Coreg at 3.125 two times a day. Will see her back in 1-2 months to titrate to 6.25 two times a day. Not interested in ICD.  Problem # 2:  PREMATURE VENTRICULAR CONTRACTIONS, FREQUENT (ICD-427.69) Chronic no change.  Patient Instructions: 1)  Your physician recommends that you schedule a follow-up appointment in: 2 MONTHS 2)  Your physician has recommended you make the following change in your medication: start coreg 3.125 mg twice daily

## 2010-05-17 NOTE — Assessment & Plan Note (Signed)
Summary: F2M/AMD   Visit Type:  Follow-up Referring Provider:  Arvilla Meres Primary Provider:  Duncan Dull  CC:  no complaints.  History of Present Illness: Katrina Jenkins  is an 77 year old woman with history of congestive heart failure secondary to nonischemic cardiomyopathy with an EF 25% and severe peripheral neuropathy.She underewent Cath 2009 demonstrating nonobsructive disease.  She also has frequent PVCs and had been placed  on amiodarone in the hopes that her ejection fraction might improve. Initiailly her EF improved to about 40% but this was tranisient and it went back down to 25% so amio was stopped .She has had effective bradycardia prob related to her PVCs and the betablockers have been discontinued;  she has had no palpitations  Overall doing farily well, has good days and bad days with fatigue. No chest pain. Mild chronic dyspnea. No orthopnea or PND. No syncope/presyncope. Recently had some squamous cell skin CAs removed from LLE. Once or 2x per week notes that she gets a spell where she gets sweaty almost like a hot flash. No palpitations or dizziness associated.   Current Medications (verified): 1)  Bayer Childrens Aspirin 81 Mg Chew (Aspirin) .... Take 1 Tablet By Mouth Once A Day 2)  Lunesta 3 Mg Tabs (Eszopiclone) .Marland Kitchen.. 1 Tab By Mouth At Bedtime As Needed Insomnia 3)  Fentanyl 100 Mcg/hr Pt72 (Fentanyl) .... Apply 1 Patch To Skin Every 48 Hours  Diagnosis 365.9 Peripheral Neuropathy 4)  Synthroid 175 Mcg Tabs (Levothyroxine Sodium) .Marland Kitchen.. 1 By Mouth Once Daily 5)  Lyrica 100 Mg Caps (Pregabalin) .Marland Kitchen.. 1 By Mouth Twice Daily 6)  Multivitamins  Tabs (Multiple Vitamin) .Marland Kitchen.. 1 By Mouth Daily 7)  Loratadine 10 Mg Tabs (Loratadine) .Marland Kitchen.. 1 By Mouth Daily 8)  Advair Diskus 250-50 Mcg/dose Aepb (Fluticasone-Salmeterol) .... 2 Puffs Two Times A Day 9)  Pravachol 80 Mg  Tabs (Pravastatin Sodium) .Marland Kitchen.. 1 By Mouth Qd 10)  Furosemide 80 Mg  Tabs (Furosemide) .... Take 1 Tablet By Mouth Once A  Day 11)  Flunisolide 29 Mcg/act  Soln (Flunisolide) .... 2 Spray Per Nostril or Back As Needed 12)  Proair Hfa 108 (90 Base) Mcg/act  Aers (Albuterol Sulfate) .... 2 Puffs Inh Q 4 Hours As Needed Wheeze 13)  Vitamin B-12 500 Mcg Tabs (Cyanocobalamin) .Marland Kitchen.. 1 By Mouth Qd 14)  Senna Concentrate 8.6 Mg Tabs (Sennosides) .... 2 By Mouth Daily 15)  Docusate Sodium 100 Mg Tabs (Docusate Sodium) .... 2 By Mouth Once Daily 16)  Hydrocodone-Acetaminophen 10-650 Mg Tabs (Hydrocodone-Acetaminophen) .... As Needed 17)  Calcium Carbonate-Vitamin D 600-400 Mg-Unit  Tabs (Calcium Carbonate-Vitamin D) .... Daily 18)  Amitriptyline Hcl 25 Mg Tabs (Amitriptyline Hcl) .Marland Kitchen.. 1 By Mouth At Bedtime 19)  Carvedilol 3.125 Mg Tabs (Carvedilol) .... Take One Tablet By Mouth Once Daily  Allergies (verified): 1)  ! Levaquin  Past History:  Past Medical History:  1. Congestive heart failure due to nonischemic CM (?PVC-related)      a. EF 20-30% by ECHO 2/10. mild MR (echo 9/09 35-40%)      b. minimal non-obstructive CAD by cath 6/09  2. Frequent PVCs     a. previously on amiodarone to suppress. limited by tremors      b. discussed ablation and CRT with Dr. Graciela Husbands but decided to defer  3. Hypertension  4. Hyperlipidemia  5. Hypothyroidism  6. Asthma  7. Allergic rhinitis  8. Osteoarthritis  9. Congenital absence of right ear 10. Peripheral neuropathy      a. unknown etiology.  Wears Fentanyl patch 11. h/o ETOH abuse, quit a long time ago 12. R foot ulcer  Vital Signs:  Patient profile:   77 year old female Height:      69 inches Weight:      176.25 pounds Pulse rate:   56 / minute Pulse rhythm:   irregular BP sitting:   106 / 70  (right arm) Cuff size:   regular  Vitals Entered By: Mercer Pod (June 25, 2009 9:57 AM)  Physical Exam  General:   Gen: well appearing. no resp difficulty HEENT: norma except for congenital abbsence of her left ear Neck: supple. JVP flat. Carotids 2+ bilat; no  bruits. No lymphadenopathy or thryomegaly appreciated. Cor: PMI nondisplaced. Irregular rhythm, No rubs, gallops, murmur. Lungs: clear Abdomen: soft, nontender, nodistension. No hepatosplenomegaly. No bruits or masses. Good bowel sounds. Extremities: no cyanosis, clubbing, rash,  no edema.  several dressings on LLE Neuro: alert & orientedx3, cranial nerves grossly intact. moves all 4 extremities w/o difficulty. affect pleasant    Impression & Recommendations:  Problem # 1:  SYSTOLIC HEART FAILURE, CHRONIC (ICD-428.22) Chronic NYHA class 3. Volume status looks good.Continue current meds. Unable to tolerate much titration due to side effects.   Problem # 2:  Hot flashes. Not sure if these are hormonal or not> Husband will take vitals dureing episodes to rule out change in BP or palpiatations.  Patient Instructions: 1)  Your physician recommends that you schedule a follow-up appointment in: 3 months 2)  Your physician recommends that you continue on your current medications as directed. Please refer to the Current Medication list given to you today.

## 2010-07-22 ENCOUNTER — Emergency Department: Payer: Self-pay | Admitting: Emergency Medicine

## 2010-08-30 NOTE — Assessment & Plan Note (Signed)
Katrina Jenkins OFFICE NOTE   Katrina Jenkins, Katrina Jenkins                     MRN:          [id number]  DATE:05/27/2007                            DOB:          11/21/1922    PRIMARY CARE PHYSICIAN:  Katrina Nora, MD.   INTERVAL HISTORY:  [name] is a very pleasant 77 -year-old woman  with a history of congestive heart failure secondary to presumed  nonischemic cardiomyopathy, ejection fraction of approximately 25-30%.  She also has history hypertension, peripheral neuropathy and  hypothyroidism.   She returns today for routine followup.  At the last visit we increased  her Lasix from 40 to 80 mg a day.  She says she feels somewhat better.  Her shortness of breath is decreased and she is no longer coughing.  She  also feels her lower extremity edema has gotten better, but feels it may  not be totally resolved.  She does have one or two episodes where she  had some diaphoresis on exertion, but denies chest pain are other  symptoms.   CURRENT MEDICATIONS:  1. Coreg 3.125 b.i.d.  2. Lasix 80 a day.  3. Aspirin 81 a day.  4. Lunesta.  5. Fentanyl patch.  6. Synthroid 150 mcg a day.  7. Fosinopril 40 a day.  8. Lyrica.  9. Advair.  10.Pravachol 80 mg a day.  11.Potassium 20 a day.   PHYSICAL EXAM:  She is an elderly woman in no acute distress who  ambulates around the clinic slowly with no respiratory difficulty, blood  pressure is 108/54, heart rate 60, weight 164, which is down 6 pounds  from previous.  HEENT is notable for congenital absence of the right ear with facial  asymmetry, otherwise normal.  NECK:  is supple and JVP is about 5-6 cm water.  Carotids are 2+  bilaterally without bruits and no lymphadenopathy or thyromegaly.  CARDIAC:  PMI is laterally displaced and she has a mild irregular rate  and rhythm with a 2/6 mitral regurgitation murmur.  No S3.  LUNGS:  Clear.  ABDOMEN:  Soft, nontender,  nondistended. No hepatosplenomegaly.  No  bruits, no masses appreciated. EXTREMITIES:  Warm.  No cyanosis or  clubbing.  There is trace to 1+ edema bilaterally.  No rash.  NEURO:  Alert and x3.  Cranial nerves II-XII are intact.  Moves all four  extremities without difficulty.  Affect is pleasant.   ASSESSMENT/PLAN:  1. Congestive heart failure secondary to presumed nonischemic      cardiomyopathy.  Volume status looks much better; do not think we      should push her diuresis anymore at this time.  She is currently      New [place] Heart Association functional class III. We will increase      her Coreg to 6.25 b.i.d. Given her exertional diaphoresis, we did      consider a possibility of right and left heart catheterization, but      she feels the diaphoresis is quite rare and would like to proceed      with medical  therapy at this time.  We will check a BMET today.  I      suspect further titration of beta-blocker will be limited due her      bradycardia.  Can consider spironolactone but will need to watch      her potassium.   DISPOSITION:  Will see her back for routine follow up in 1 month.     [name]. Bensimhon, MD  Electronically Signed    DRB/MedQ  DD: 05/27/2007  DT: 05/27/2007  Job #: [id number]   cc:   Katrina Nora, MD

## 2010-08-30 NOTE — Assessment & Plan Note (Signed)
Katrina Jenkins OFFICE NOTE   Katrina Jenkins, Katrina Jenkins                     MRN:          [id number]  DATE:06/27/2007                            DOB:          [birth year]    PRIMARY CARE PHYSICIAN:  Dr. Kerby Jenkins.   INTERVAL HISTORY:  Katrina Jenkins is a very pleasant 77 year old woman with  a history of congestive heart failure secondary to presumed nonischemic  cardiomyopathy with ejection fraction about 25-30%.  She also has a  history of hypertension, peripheral neuropathy, and hypothyroidism.   She returns today for routine followup.  Overall she says she is doing  fairly well.  She was worried that she was not losing as much fluid  weight as she wanted to and increase her Lasix to 80 b.i.d.  She says  her legs are much better now and the compression hose has helped a lot  to.  She does occasionally feel dizzy but has not had any presyncope or  syncope.  Feels that her shortness of breath is improved but still has  class III symptoms.  She has not had any chest pain.  No orthopnea or  PND.   CURRENT MEDICATIONS:  Include Coreg 6.25 b.i.d., Lasix 80 b.i.d.,  aspirin 81, Fentanyl patch, Synthroid 150 mcg a day, lisinopril 40 a  day, Lyrica 100 mg in the morning 100 mg at noon and 200 at night,  multivitamin, Advair, Pravachol 80 a day, and potassium 20 a day.   PHYSICAL EXAM:  She is in no acute distress.  Ambulates around the  clinic without respiratory difficulty.  Blood pressure is 96/50, heart rate of 58, weight is 166.  This is up 2  pounds from previous.  HEENT is notable for congenital absence of the right year with facial  asymmetry.  Otherwise normal.  Neck is supple.  No obvious JVD, carotid 2+ bilateral without bruits.  There is no lymphadenopathy or thyromegaly.  CARDIAC:  PMI is laterally displaced.  She has regular rate and rhythm  with a 2/6 mitral regurgitation murmur.  No S3.  LUNGS:  Clear.  ABDOMEN:  Soft, nontender, nondistended.  No hepatosplenomegaly.  No  bruits, no masses.  Good bowel sounds.  EXTREMITIES:  Warm with no cyanosis or clubbing.  There is just trace  edema.  No rash.  NEURO:  Alert and oriented x3.  Cranial nerves II-XII intact.  Moves all  four extremities without difficulty.  Affect is pleasant.   ASSESSMENT/PLAN:  Congestive heart failure secondary to presumed  nonischemic cardiomyopathy.  We have discussed cardiac catheterization  but she still would like to hold off on this.  She continues to be  stable NYHA functional class III.  I think her volume status is okay if  not a little bit down.  We will check a BMET and a BNP today to help  guide her diuretic regimen.  Given her low blood pressure, bradycardia,  we cannot tolerate her beta blocker any further at this point.  We will  need to consider spironolactone in the future.   DISPOSITION:  Will continue current therapy and see her back in 1 to 2  months for routine followup.     Katrina Buckles. Bensimhon, MD  Electronically Signed    DRB/MedQ  DD: 06/27/2007  DT: 06/28/2007  Job #: [id number]   cc:   Katrina Nora, MD

## 2010-08-30 NOTE — Assessment & Plan Note (Signed)
[facility]                            [place] OFFICE NOTE   KIANAH, HARRIES                     MRN:          [id number]  DATE:05/03/2007                            DOB:          1922/08/20    REFERRING PHYSICIAN:  Kerby Nora, MD   REASON FOR CONSULTATION:  Abnormal stress test and echocardiogram.   HISTORY OF PRESENT ILLNESS:  Ms. Katrina Jenkins is an 77 year old woman with a  history of congestive heart failure as well as hypertension, severe  peripheral neuropathy, and previous alcohol abuse who presents today for  further evaluation of abnormal echo and Myoview.   She and her husband recently moved back to [place] from [place]  about a year ago to be nearer their kids.  She tells me, about 15 years  ago, she developed shortness of breath and underwent a stress test and  catheterization.  She was diagnosed with congestive heart failure.  She  does not know what her ejection fraction was.  However, she was put on a  fluid pill and digoxin, so I am assuming she did have systolic  dysfunction.  She denies having any blockages on the catheterization.  According to records from Dr. Daphine Deutscher office, she also apparently had  an echocardiogram done in [place] in 2006, which showed an EF of 55-65%,  but I have no verification of this.   Apparently, she has been getting her shortness of breath for several  months and also having some swelling.  Her husband finally made her go  to see Dr. Ermalene Searing.  She was referred for an echocardiogram, which  showed an EF of 25-30% with severe global hypokinesis and severe mitral  regurgitation.  A Myoview showed an ejection fraction of 30%.  There was  a fixed defect on the inferior wall, but no clear ischemia.  LV was  markedly dilated.   She has been quite short of breath.  However, she says it is hard for  her to tell somewhat because her ambulation is so limited due to her  severe neuropathy.  She has  had orthopnea, PND, and for the first time  last night, she developed some wheezing while trying to sleep.  Her  weight is up probably about 10 pounds.   REVIEW OF SYSTEMS:  She denies any fevers or chills.  No chest pain.  No  nausea or vomiting.  No focal neurologic symptoms.  Remainder of the  review of systems is negative, except for HPI and problem list.   PAST MEDICAL HISTORY:  1. History of congestive heart failure, details as per HPI.  2. Severe peripheral neuropathy of unknown cause.  Maintained on      Fentanyl patch.  3. Hypertension.  4. 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  5. History of significant alcohol abuse, but stopped about 10 years      ago.  6. Hypothyroidism.  7. Hyperlipidemia.   CURRENT MEDICATIONS:  1. Aspirin 81 a day.  2. Lunesta 3 mg nightly.  3. Lorcet.  4. Fentanyl patch.  5. Synthroid 50 mcg a day.  6. Lisinopril  40 mg a day.  7. Lyrica 100 b.i.d.  8. Multivitamin.  9. Loratadine 10 mg a day.  10.Advair 250/50 b.i.d.  11.Pravachol 80 a day.  12.Lasix 20 a day.   ALLERGIES:  IODINE.   SOCIAL HISTORY:  She is married.  She lives with her husband.  She has 2  children.  She denies any tobacco use.  She did have a history of  previous alcohol abuse.   FAMILY HISTORY:  Mother died at 44 due to atherosclerosis.  Father died  at age 58 due to a heart attack.   PHYSICAL EXAM:  She is an elderly woman who ambulates slowly around the  clinic with no respiratory distress.  Blood pressure 128/74, heart rate 80, weight 170.  HEENT:  Notable for a congenital absence of the right ear with her  facial asymmetry as a result.  There is no scleral icterus or  xanthelasma.  Otherwise, normal.  NECK:  Supple.  JVP is about 8 to 9 cm.  Carotids are 2+ bilaterally  without bruits.  There is no lymphadenopathy or thyromegaly.  CARDIAC:  PMI is laterally displaced.  She has a regular rate and  rhythm.  There is a prominent 3/6 mitral regurgitation murmur.   No S3.  LUNGS:  Clear.  ABDOMEN:  Soft, distended, nontender.  No hepatosplenomegaly  appreciated.  No bruits.  No masses.  Hypoactive bowel sounds.  EXTREMITIES:  Warm with no cyanosis or clubbing.  There is 1 to 2+ edema  bilaterally and changes consistent with chronic venous stasis.  No rash.  NEURO:  Alert and oriented x3.  Cranial nerves 2-12 are intact.  Moves  all 4 extremities without difficulty.  Affect is pleasant.   EKG shows a sinus rhythm with right bundle branch block.  There are  frequent PVC.  Heart rate is 77.  There is also LVH.   ASSESSMENT AND PLAN:  1. Acute systolic heart failure.  My suspicion is that this is a      nonischemic myopathy, but she certainly could have coronary artery      disease.  Given the fact that there is no ischemia on her Myoview,      we will defer catheterization at this time.  We will increase her      Lasix to 40 mg once a day and have her take her first dose this      afternoon.  We will also put her on potassium.  Once her fluid      starts to get better, we will start her on Coreg 3.125 b.i.d.  I      did give her instructions that she could take an extra Lasix as      needed, and also that, if she is not getting better in the next day      or two, that she needs to contact me and we can consider admission      for diuresis.  We will see her back in 1 week for further      management of her congestive heart failure.  2. Hypertension, well controlled.  3. Hyperlipidemia.  Continue Pravachol.     [name]. Bensimhon, MD  Electronically Signed    DRB/MedQ  DD: 05/03/2007  DT: 05/03/2007  Job #: [id number]

## 2010-08-30 NOTE — Assessment & Plan Note (Signed)
[facility] OFFICE NOTE   Katrina Jenkins, Katrina Jenkins                     MRN:          [id number]  DATE:12/19/2007                            DOB:          September 16, 1922    PRIMARY CARE PHYSICIAN:  Kerby Nora, MD.   INTERVAL HISTORY:  She is a very pleasant 77 year old woman with a  history of congestive heart failure, secondary nonischemic  cardiomyopathy, ejection fraction 20-25%.  She also has hypertension,  peripheral neuropathy, hypothyroidism, and frequent PVCs.  A cardiac  catheterization in June 2009 showed minimal nonobstructive coronary  artery disease, wedge pressure was 9, and cardiac index was 2.8.   At that point, I thought her myopathy might be due to her PVCs.  We  started her on amiodarone.  She had a followup echo yesterday which  showed an ejection fraction of 35-40%, which seems like she has had some  recovery in her LV function.   She returns today for a routine followup and overall doing fairly well.  She was recently at [facility] for an episode of altered mental status thought  secondary to possibly her pain medications.  From a cardiac perspective,  she feels a little bit full, but denies any orthopnea.  No PND.  She has  had mild swelling.  No change in her chronic dyspnea.   CURRENT MEDICATIONS:  1. Coreg 6.25 b.i.d.  2. Lasix 80 a day.  3. Amiodarone 200 a day.  4. Synthroid 125 a day.  5. B12.   PHYSICAL EXAMINATION:  GENERAL:  She is in no acute distress and  ambulates around the clinic slowly without respiratory difficulty.  VITAL SIGNS:  Blood pressure is 114/60, heart rate 65, weight is 176,  which is up 10 pounds.  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  NECK:  Supple.  JVP is about 8-9 cm of water.  Carotids are 2+ bilateral  without bruits.  There is no lymphadenopathy or thyromegaly.  CARDIAC:  PMI is laterally displaced.   She has distant heart sounds.  She is regular with no murmurs, rubs, or gallops.  There is no  significant ectopy.  LUNGS:  Clear.  ABDOMEN:  Soft, nontender, and nondistended.  No hepatosplenomegaly, no  bruits, and no masses.  EXTREMITIES:  Warm with no cyanosis or clubbing.  There is 1-2+ edema  bilaterally.  No rash.  NEUROLOGIC:  Alert and oriented x3.  Cranial nerves II through XII are  intact.  Moves all 4 extremities without difficulty.  Affect is  pleasant.   ASSESSMENT/PLAN:  1. Congestive heart failure secondary to nonischemic cardiomyopathy.      Her ejection fraction seems to be improved with the amiodarone and      suppression of her premature ventricular contractions.  We will      continue this and recheck her echocardiogram in 6 months.  She does      seem to have some fluid on board today.  We will increase her Lasix      to 80 b.i.d.  for 3 days and go back down.  2. Hypothyroidism.  Her Synthroid was recently decreased that is being      followed by her primary care physician.   DISPOSITION:  We will see her back in clinic in several months for  routine followup.     [name]. Bensimhon, MD  Electronically Signed    DRB/MedQ  DD: 12/19/2007  DT: 12/20/2007  Job #: [id number]

## 2010-08-30 NOTE — Assessment & Plan Note (Signed)
[facility] OFFICE NOTE   Katrina, Jenkins                     MRN:          [id number]  DATE:10/09/2007                            DOB:          [birth year]    PRIMARY CARE PHYSICIAN:  Kerby Nora, MD   INTERVAL HISTORY:  Katrina Jenkins is a very pleasant 77 year old woman,  to be 77 years old tomorrow, with a history of congestive heart failure  secondary to nonischemic cardiomyopathy, EF of 20-25%.  She also has  hypertension, peripheral neuropathy, hypothyroidism, and frequent PVCs.   She returns today for routine post cath followup.  She underwent cardiac  catheterization last week which showed minimal nonobstructive coronary  artery disease with severe LV dysfunction with EF of 20-25% and Fick  cardiac output was 5.4 L per minute with a cardiac index of 2.8 L per  minute.  Wedge pressure was mean of 9.  Since her catheterization, she  has been doing okay.  She remains somewhat short of breath but really  unchanged.  She has not had any chest pain.  She does have a tremor  which concerns her.  She has not had any orthopnea or PND.   CURRENT MEDICATIONS:  1. Coreg 6.25 mg b.i.d.  2. Compression hose.  3. Lasix 80 mg a day.  4. Aspirin 81 a day.  5. Lunesta.  6. Fentanyl patch.  7. Synthroid 150 mcg a day.  8. Lisinopril 40 a day.  9. Lyrica 100 at noon and 200 at night.  10.Multivitamin.  11.Loratadine 10 mg a day.  12.Calcium.  13.Pravachol 80 mg a day.  14.Advair Discus.   PHYSICAL EXAMINATION:  GENERAL:  She is in no acute distress, ambulatory  around the clinic slowly without any respiratory difficulty.  VITAL SIGNS:  Blood pressure is 100/56, heart rate is 64, and weight is  171 which is down 3 pounds.  HEENT:  Notable for congenital absence of the right ear with facial  asymmetry.  Otherwise normal.  NECK:  Supple.  JVP is flat.  Carotids are 2+ bilaterally without any  bruits.  There  is no lymphadenopathy or thyromegaly.  CARDIAC:  PMI is laterally displaced.  She has distant heart sounds.  She is regular with just mild ectopy.  No murmur.  No S3.  LUNGS:  Clear.  ABDOMEN:  Soft, nontender, and nondistended.  No hepatosplenomegaly.  No  bruits, no mass.  EXTREMITIES:  Warm with no cyanosis, clubbing, or edema.  She does have  trace to 1+ edema bilaterally.  No rash.  NEUROLOGIC:  Alert and oriented x3.  Cranial nerves II through XII are  grossly intact.  Moves all 4 extremities without difficulty.  Affect is  pleasant.   ASSESSMENT AND PLAN:  1. Congestive heart failure secondary to nonischemic cardiomyopathy.      The exact etiology is unclear.  She does have somewhat frequent      premature ventricular contractions which are approximately 2% of      her total heartbeats.  There is some question whether or not this  is ectopy related although her volume of premature ventricular      contractions does not seem significant to cause this.  However, she      has had a significant bigeminy in the past.  We have discussed this      and I think it is reasonable to go ahead with a short trial of      amiodarone.  We will try her on amiodarone 400 mg for 3 months to      see if suppressing the premature ventricular contractions helps her      ejection fraction recover any; if not, we can just stop this.      Overall, her volume status looks good.  We will continue her other      medications.  2. History of hyperkalemia.  This has been stable.   DISPOSITION:  We will see her back in clinic in 6 weeks for followup.     Katrina Buckles. Bensimhon, MD  Electronically Signed    DRB/MedQ  DD: 10/09/2007  DT: 10/10/2007  Job #: [id number]

## 2010-08-30 NOTE — Assessment & Plan Note (Signed)
[facility] OFFICE NOTE   Katrina Jenkins, Katrina Jenkins                     MRN:          [id number]  DATE:05/09/2007                            DOB:          07-06-1922    PRIMARY CARE PHYSICIAN:  Dr. Kerby Nora.   INTERVAL HISTORY:  Katrina Jenkins is a very pleasant 77 year old woman with  a history of congestive heart failure secondary to presumed nonischemic  cardiomyopathy, ejection fraction approximately 25-30%.  Also has a  history of hypertension,  peripheral neuropathy, and hypothyroidism.   I first saw her last week.  She had some fluid overload.  We started her  on Lasix and also low dose Coreg.  She returns today for routine follow-  up.  She says she has not had much response to Lasix.  Her weight is the  same.  Her edema is about the same.  However, she says her breathing is  somewhat better.  She has not had any chest pain.  No orthopnea, no PND.  Her wheezing has resolved.   CURRENT MEDICATIONS:  1. Coreg 3.125 b.i.d.  2. Lasix 40 a day.  3. Potassium 20 a day.  4. Pravachol 80 a day.  5. Advair.  6. Multivitamin.  7. Lyrica.  8. Lisinopril 40 a day.  9. Synthroid 150 mcg a day.  10.Fentanyl patch.  11.Lorcet.  12.Aspirin 81 a day.  13.Lunesta.  14.Fiber laxative.   PHYSICAL EXAMINATION:  GENERAL:  She is an elderly woman in no acute  distress.  Ambulates around the clinic slowly with no respiratory  difficulty.  VITAL SIGNS:  Blood pressure is 118/78, heart rate 58, weight 178 which  is unchanged.  HEENT:  Notable for congenital absence of the right ear with facial  asymmetry.  There is no scleral icterus or xanthelasmas; otherwise,  normal.  NECK:  Supple.  JVP is about 6 cm of water.  Carotids are 2+ bilaterally  without bruits.  There is no lymphadenopathy or thyromegaly.  CARDIAC:  PMI is laterally displaced, regular rate and rhythm, prominent  3/6 mitral regurgitation murmur.  No S3.  LUNGS:  Clear.  ABDOMEN:  Soft, nontender, nondistended.  No hepatosplenomegaly.  No  bruits, no masses.  Good bowel sounds.  EXTREMITIES:  Warm with no cyanosis or clubbing.  There is trace to 1+  edema bilaterally and changes consistent with chronic venous stasis, no  rash.  NEUROLOGICAL:  Alert and x3.  Cranial nerves II-XII are intact.  Moves  all four extremities without difficulty.  Affect is pleasant.   ASSESSMENT/PLAN:  Congestive heart failure secondary to presumed  nonischemic myopathy.  Her volume status does seem to be just a bit  better today and she is symptomatically improved, although her weight is  unchanged.  Before we push her diuresis any further I am going to check  BMET and BNP just to see where she is at.  If needed we can consider  adding a little Zaroxolyn, though I do not think she has much fluid on  board.  We will add compression stockings.  She  is on a good dose of ACE  inhibitor.  Unfortunately her beta blocker dose is limited by her  bradycardia.   DISPOSITION:  Will keep her on current meds for now and check blood  work.  I will see her back in two or three weeks for continued follow-  up.     Katrina Buckles. Bensimhon, MD  Electronically Signed    DRB/MedQ  DD: 05/09/2007  DT: 05/09/2007  Job #: [id number]   cc:   Kerby Nora, MD

## 2010-08-30 NOTE — Cardiovascular Report (Signed)
Katrina Jenkins, Katrina Jenkins NO.:  [id number]   MEDICAL RECORD NO.:  [id number]          PATIENT TYPE:  OIB   LOCATION:  1963                         FACILITY:  [facility]   PHYSICIAN:  [name]. Bensimhon, MDDATE OF BIRTH:  [birth year]   DATE OF PROCEDURE:  DATE OF DISCHARGE:                            CARDIAC CATHETERIZATION   INTERVAL HISTORY:  Katrina Jenkins is a delightful 77 year old woman with a history  of congestive heart failure secondary to presumed nonischemic  cardiomyopathy.  She has an EF in the 25% range.  She had her last  cardiac catheterization about 15 or 20 years ago in [place].  We have  been following her closely for volume overload.  Her LV dysfunction has  not been responding well to beta-blockers or ACE inhibitors.  She is  brought for a diagnostic right and left heart catheterization.   PROCEDURES PERFORMED:  1. Right heart catheterization.  2. Left heart catheterization.  3. Left ventriculogram.  4. Selective coronary angiography.   DESCRIPTION OF PROCEDURE:  The risks and indications of catheterization  were explained.  Consent was signed and placed on the chart.  A 4-French  arterial sheath was placed in the right femoral artery using modified  Seldinger technique.  Then, the catheters including JR4 and angled  pigtail was used for the procedure.  All catheter exchanged over the  wire and there were no apparent complications.  A 7-French venous sheath  was placed in the right femoral vein using a modified Seldinger  technique.  Swan-Ganz catheter was used for right heart catheterization.  Once again, no apparent complications.   HEMODYNAMICS:  Right atrial pressure mean of 6.  RV pressure 28/10.  PA  pressure 32/10 with a mean of 20.  Pulmonary capillary wedge pressure  mean of 9.  LV pressure 135/15.  Central aortic pressure 123/54 with a  mean of 84.  There was no aortic stenosis or mitral stenosis.   Fick cardiac output was 5.4 L per minute.   Cardiac index was 2.8 L per  minute per meter squared.  Pulmonary vascular resistance was 1.9 Woods  units.   Left main was normal.   LAD was a long vessel coursing to the apex, gave off 4 diagonal  branches.  There was a 40% tubular lesion in the proximal portion with  significant calcification.   Left circumflex gave off a small OM-1 and a large OM-2.  There was a 40%  lesion in the mid AV groove circumflex.   Right coronary artery was a large dominant vessel, gave off RV branch, a  PDA, and a posterolateral.  There was a 30% lesion in the mid RCA.   Left ventriculogram done in the RAO position showed a dilated left  ventricle with severe global hypokinesis.  Ejection fraction was  estimated at 20%-25%.  There was no significant mitral regurgitation  noted.   ASSESSMENT:  1. Nonobstructive coronary artery disease.  2. Severe left ventricular dysfunction secondary to nonischemic      cardiomyopathy.  3. Filling pressures appear to be optimized.   PLAN:  Continue medical therapy.  I  would consider antiarrhythmic to  address her frequent PVCs to see if this may help improve her ejection  fraction.      [name]. Bensimhon, MD  Electronically Signed     [name]. Bensimhon, MD  Electronically Signed    DRB/MEDQ  D:  09/27/2007  T:  09/27/2007  Job:  [id number]

## 2010-08-30 NOTE — Assessment & Plan Note (Signed)
[facility] OFFICE NOTE   Katrina, Jenkins                     MRN:          [id number]  DATE:08/21/2007                            DOB:          [birth year]    PRIMARY CARE PHYSICIAN:  Dr. Kerby Nora.   INTERVAL HISTORY:  Katrina Jenkins is a very pleasant 77 year old woman with a  history of congestive heart failure secondary to presumed nonischemic  cardiomyopathy, ejection fraction of 25-30%.  She also has a history of  hypertension, peripheral neuropathy and hypothyroidism.   She returns today for routine follow-up.  She is doing well.  She denies  any significant edema.  She does have some chronic shortness of breath,  but this has improved.  No chest pain, no orthopnea, no PND.   CURRENT MEDICATIONS:  1. Coreg 6.25 b.i.d.  2. Lasix 80 b.i.d.  3. Aspirin 81.  4. Lunesta 3.  5. Fentanyl patch.  6. Levothyroxine 150 mcg a day.  7. Lisinopril 40 a day.  8. Lyrica 100 on the morning, 100 at noon and 200 at night.  9. Multivitamin.  10.Loratadine 10 a day.  11.Advair Diskus.  12.Pravachol 80.  13.Potassium 20.   PHYSICAL EXAMINATION:  GENERAL:  She is no acute distress, ambulates  around the clinic without any respiratory difficulty.  VITAL SIGNS:  Blood pressure is 90/56, heart rate is 77.  Weight is 170,  which is up a few pounds.  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  NECK:  Supple.  No JVD.  Carotid are 2+  bilaterally without bruits.  There is no lymphadenopathy or thyromegaly.  CARDIAC:  PMI is laterally displaced.  She is mildly irregular with 2/6  mitral regurgitation murmur.  No S3.  LUNGS:  Clear.  ABDOMEN:  Soft, nontender, nondistended.  No hepatosplenomegaly.  No  bruits, no masses.  Good bowel sounds.  EXTREMITIES:  Warm with no cyanosis or clubbing.  She has her  compression hose on.  There is trace edema.  No rash.  NEURO:   Alert and oriented x3.  Cranial nerves II-XII grossly intact.  Moves all four extremities without difficulty.  Affect is pleasant.   EKG shows sinus rhythm with frequent PVCs and a pattern of bigeminy.  She has bifascicular block and a right bundle branch block and a left  anterior fascicular block.  Heart rate 77.   ASSESSMENT/PLAN:  1. Congestive heart failure secondary to presumed nonischemic      cardiomyopathy.  She is doing well, NYHA class II-III.  Volume      status looks good.  I question today if whether or not her myopathy      might be related to her frequent PVCs and ectopy.  We will put a 48      hour Holter monitor on her to gauge the exact volume of her ectopy.      If this is greater than 15-20% of her total volume, I would be      suspicious for a possible PVC related myopathy  and could consider      an antiarrhythmic, possibly sotalol or amiodarone.  2. We will get the results of her Holter monitor and also get an      echocardiogram in the near future, and see her back in a month to      make a decision.  3. Hyperkalemia.  Her potassium was 5.3 on her last check.  We will      recheck that today.  I suspect we may need to stop her potassium.     [name]. Bensimhon, MD  Electronically Signed    DRB/MedQ  DD: 08/21/2007  DT: 08/21/2007  Job #: [id number]

## 2010-08-30 NOTE — Assessment & Plan Note (Signed)
[facility] OFFICE NOTE   Katrina Jenkins, Katrina Jenkins                     MRN:          [id number]  DATE:11/22/2007                            DOB:          09/22/1922    PRIMARY CARE PHYSICIAN:  Kerby Nora, MD   INTERVAL HISTORY:  Katrina Jenkins is a very pleasant 77 year old woman with a  history of congestive heart failure secondary to nonischemic  cardiomyopathy, ejection fraction of 20-25%.  She also has hypertension,  peripheral neuropathy, hypothyroidism, and frequent PVCs.  She underwent  cardiac catheterization in June, showed minimal nonobstructive coronary  artery disease with an EF of 20-25%, wedge pressure was 9, and cardiac  index was 2.8.  At that point, I thought her myopathy might be due to  her PVCs.  We started her on amiodarone.  First, she does not like this  very much, but has gotten used to it, although she notes that her tremor  is worse.   She returns for a routine followup.  Overall, is doing fairly well.  She  unfortunately had a bit of a fall the other day after she tripped.  There are no palpitations.  There is no chest pain.  She feels like her  swelling in her stump has gotten worse, but her leg edema is doing fine.  Her weight is actually down.   CURRENT MEDICATIONS:  1. Coreg 6.25 b.i.d.  2. Lasix 80 a day.  3. Amiodarone 400 a day.  4. Aspirin 81.  5. Lunesta.  6. Fentanyl patch for pain.  7. Levothyroxine 150 mcg a day.  8. Lisinopril 40 a day.  9. Loratadine 10 a day.  10.Multivitamin.  11.Lyrica 100 in the morning, 100 at noon, and 200 at night.  12.Pravachol 80 a day.   PHYSICAL EXAMINATION:  GENERAL:  She is in no acute distress, ambulatory  around the clinic slowly, without any respiratory difficulty.  VITAL SIGNS:  Blood pressure is 126/74, heart rate is 52, and weight is  166, which is down 5 pounds from before.  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  Otherwise, normal.  NECK:  Supple.  JVP is flat.  Carotids are 2+ bilaterally without  bruits.  There is no lymphadenopathy or thyromegaly.  CARDIAC:  PMI is laterally displaced.  She has distant heart sounds.  She is regular with no murmurs or gallops.  LUNGS:  Clear.  ABDOMEN:  Soft, nontender, and nondistended.  No hepatosplenomegaly.  No  bruits.  No masses.  Good bowel sounds.  EXTREMITIES:  Warm with no cyanosis or clubbing.  There is just trace  edema around her ankles.  No rash.  NEURO:  Alert and oriented x3.  Cranial nerves II-XII are grossly  intact.  Moves all fours without difficulty.  Affect is pleasant.   ASSESSMENT AND PLAN:  1. Congestive heart failure secondary to nonischemic cardiomyopathy.      This is stable.  She is New York Heart Association Class III with      good volume status.  We  are trying amiodarone to see if suppressing      her premature ventricular contractions helps improve her ejection      fraction.  We will get a followup echocardiogram in about 6 weeks      to see how she is doing.  2. Premature ventricular contractions.  EKG today shows suppression of      the premature ventricular contractions.  However, she is having      some increased tremor on the amiodarone and we will decrease it to      200 mg a day.  3. Hyperlipidemia.  She is due for cholesterol check.  4. Hypothyroidism.  Her TSH is actually a bit on the lower side today,      and her free T4 is elevated at 2.2.  We will decrease her      levothyroxine 125 mcg a day, and check again when she returns in 4-      6 weeks.     [name]. Bensimhon, MD  Electronically Signed    DRB/MedQ  DD: 11/22/2007  DT: 11/23/2007  Job #: [id number]   cc:   Kerby Nora, MD

## 2010-08-30 NOTE — Assessment & Plan Note (Signed)
[facility] OFFICE NOTE   Katrina Jenkins, Katrina Jenkins                     MRN:          [id number]  DATE:09/23/2007                            DOB:          01-22-1923    PRIMARY CARE PHYSICIAN:  Kerby Nora, MD   INTERVAL HISTORY:  Katrina Jenkins is a very pleasant 77 year old woman with a  history of congestive heart failure, presumed secondary to nonischemic  cardiomyopathy, ejection fraction at 25% to 30%.  Her last  catheterization was over 15 years ago.  She also has hypertension,  peripheral neuropathy, hypothyroidism, and frequent PVCs.   She returns today for routine followup.  She says she continues to feel  quite fatigued.  Her husband says that she has to stop just twice  walking to the car.  She attributes this mostly due to her neuropathy  and leg weakness and imbalance, but she also feels her shortness of  breath is getting somewhat worse.  Her lower extremity edema is much  better.  She does have two-pillow orthopnea.  She has not had any chest  pain.   At last visit, she was having frequent PVCs, so we put a monitor on her.  This showed sinus rhythm with occasional PVCs and very brief runs of  short SVT, total PVCs averaged 33 per hour, which is less than 1% or 2%  of her total beats.   CURRENT MEDICATIONS:  1. Coreg 6.25 mg b.i.d.  2. Lasix 80 mg a day.  3. Aspirin 81 mg a day.  4. Lunesta 3 mg nightly.  5. Lorcet p.r.n.  6. Fentanyl patch.  7. Synthroid 150 mcg a day.  8. Fosinopril 40 mg a day.  9. Lyrica 100 mg in the a.m., 100 mg at noon, and 200 mg at night.  10.Multivitamin.  11.Advair Diskus 250/50 mcg p.r.n.  12.Pravachol 80 mg a day.   PHYSICAL EXAMINATION:  She is in no acute distress.  Ambulates in the  clinic slowly without any respiratory difficulty.  Blood pressure is  92/60, heart rate 65, and weight 174.  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  NECK:  Supple.  JVP is about 6 cm of water.  Carotids are 2+ bilaterally  without bruits.  There is no lymphadenopathy or thyromegaly.  CARDIAC:  PMI is laterally displaced.  She has very distant heart  sounds.  She is irregular with a 2/6 mitral regurgitation murmur.  No  S3.  LUNGS:  Clear.  ABDOMEN:  Soft, nontender, and nondistended.  No hepatosplenomegaly.  No  bruits.  No masses.  Good bowel sounds.  EXTREMITIES:  Warm with no cyanosis or clubbing.  She does have a  compression hose on.  There is trace edema.  No rash.  NEUROLOGIC:  Alert and oriented x3.  Cranial nerves II through XII are  grossly intact.  Moves all 4 extremities without difficulty.  Affect is  pleasant.   EKG shows sinus rhythm with bigeminy.   ASSESSMENT/PLAN:  1. Congestive heart failure secondary to presumed nonischemic  cardiomyopathy.  She is NYHA class III, volume status looks good.      Despite the fact that her premature ventricular contraction volume      is less than 5% of her total beats, I do wonder whether or not this      could be the source of her fatigue, and perhaps even her      cardiomyopathy.  We did discuss possibly starting her on Amiodarone      today to see if this would help improve this.  However, given that      her last catheterization was more than 15 years ago, I think, we      probably should start with right and left catheterization to rule      out a significant underlying coronary artery disease.  We will      perform this later this week in the outpatient laboratory.  2. Chronic history of hyperkalemia.  We stopped her potassium.  We      will recheck.     [name]. Bensimhon, MD  Electronically Signed    DRB/MedQ  DD: 09/23/2007  DT: 09/24/2007  Job #: [id number]   cc:   Kerby Nora, MD

## 2010-08-30 NOTE — Assessment & Plan Note (Signed)
[facility] OFFICE NOTE   Katrina, Jenkins                     MRN:          [id number]  DATE:03/19/2008                            DOB:          [birth year]    PRIMARY CARE PHYSICIAN:  Kerby Nora, MD   Katrina Jenkins is a delightful 77 year old woman with a history of  congestive heart failure secondary to nonischemic cardiomyopathy and  ejection fraction was previously 20-25%, most recently, 35-40%.  I  question whether this may be due to frequent PVCs.  She also has a  history of hypertension, peripheral neuropathy, hypothyroidism.   Overall, she is doing fairly well.  She was unable to tolerate  amiodarone very well due to increasing tremors.  She is now taking 50 mg  a day or a quarter of a pill.  She does have occasional dyspnea.  This  is very mild and improved from before and no chest pain, mild lower  extremity edema which is much improved.  She denies any palpitations.  No syncope or presyncope.   CURRENT MEDICATIONS:  1. Aspirin 81 a day.  2. Lunesta.  3. Fentanyl patch for chronic pain.  4. Lisinopril 40 a day.  5. Lyrica.  6. Loratadine 10 a day.  7. Pravachol 80 a day.  8. Coreg 6.25 b.i.d.  9. Compression hose.  10.Lasix 80 a day.  11.Synthroid 125 mcg a day.  12.Amiodarone 50 a day.  13.Aricept 5 a day.  14.Advair.   PHYSICAL EXAMINATION:  GENERAL:  She is in no acute distress.  Ambulates  around the clinic without respiratory difficulty.  VITAL SIGNS:  Blood pressure is 108/56, heart rate is 80, weight is 175.  HEENT:  Congenital absence of the right ear with facial asymmetry,  otherwise normal.  NECK:  Supple.  JVD is flat.  Carotids are 2+ bilaterally without  bruits.  There is no lymphadenopathy or thyromegaly.  CARDIAC:  PMI is nondisplaced.  She has distant heart sounds.  She is  regular with distant occasional ectopy.  No murmur.  No S3.  LUNGS:  Clear.  ABDOMEN:  Soft,  nontender, nondistended.  No hepatosplenomegaly.  No  bruits.  No masses.  Good bowel sounds.  EXTREMITIES:  Warm with no cyanosis or clubbing.  There is trace edema  bilaterally.  No rash.  NEUROLOGIC:  Alert and oriented x3.  Cranial nerves II-XII are intact.  Moves all 4 extremities without difficulty.   ASSESSMENT AND PLAN:  1. Congestive heart failure secondary to nonischemic cardiomyopathy,      question premature ventricular contraction related:  She is doing      well, NYHA class II-III.  Volume status looks good.  We will      continue current regimen.  Ideally, I would like her to continue to      tolerate her Coreg, but I want to see if she can tolerate the      Amiodarone a bit first.  We will see her back in 3 months with a      repeat echocardiogram and see what her  ejection fraction is doing.  2. Hyperlipidemia:  This is followed by primary care physician.  3. Premature ventricular contractions:  Continue amiodarone.  She will      get LFTs and thyroid testing at her next visit.     Katrina Jenkins. Bensimhon, MD  Electronically Signed    DRB/MedQ  DD: 03/19/2008  DT: 03/20/2008  Job #: [id number]

## 2010-09-02 NOTE — Assessment & Plan Note (Signed)
[facility]                           [place] OFFICE NOTE   LAMONT, GLASSCOCK                         MRN:          [id number]  DATE:03/21/2006                            DOB:          07-13-1922    CHIEF COMPLAINT:  An 77 year old female here to establish new doctor.   HISTORY OF PRESENT ILLNESS:  Ms. Muldrew moved here from [place]  along with her husband to be closer to her children.  They live in an  independent living apartment in [place].  The facility provides them  with meals as well as home care, but they are still able to be fairly  independent.   She is doing very well but does need refills of all her chronic  medications.   1. Peripheral neuropathy, chronic.  She had previously been seen at a      chronic pain center.  She states her pain has been going on for 20      years.  It is now very well controlled on a Fentanyl patch 100 mcg      every 48 hours.  She also uses Lorcet 10/650, 1-4 tablets daily as      needed.  She states that with these medications she is able to      function well.  She would like to not go back to chronic pain      center if possible, as long as I am willing to prescribe these      medications.  2. Congestive heart failure, stable:  She had an echocardiogram in      2006 which showed well-controlled congestive heart failure with an      ejection fraction of 55-65%. It is unclear as to whether this was      previously systolic congestive heart failure, but she is currently      well-controlled on digoxin, fosinopril, hydrochlorothiazide and      aspirin daily.  She had a recent cholesterol panel evaluated in      June 2007.  3. Hypertension, stable, well-controlled:  She does not usually have      problems with her blood pressure as she is on the previously noted      medications.  She denies any side effects from these above      medications.  4. Hypercholesterolemia, stable:  This was  recently checked as stated      above.  She continues to take Lescol XL for this.  5. Hypothyroidism, stable:  She has recently had a thyroid stimulating      hormone checked in June 2007.  She continues to be well-controlled      on Synthroid 150 mcg daily.   REVIEW OF SYSTEMS:  No headache, no dizziness, no syncope.  She wears  glasses.  She has some gradual hearing loss, but nothing extreme.  She  denies chest pain, palpitations, shortness of breath, no nausea,  vomiting, diarrhea, constipation, or rectal bleeding.  Occasional joint  pains, but nothing more than her arthritis.   PAST MEDICAL HISTORY:  1. Peripheral  neuropathy.  2. Osteoarthritis.  3. Childhood asthma.  4. Allergic rhinitis.  5. Congestive heart failure.  6. Hypertension.  7. Hypercholesterolemia.  8. Hypothyroidism.   HOSPITALIZATIONS/SURGERIES/PROCEDURES:  1. In 1999, knee replacement.  2. In 1999, screws placed in foot for degenerating bones.  3. In 1996, DEXA within normal limits.  4. May 2006, echocardiogram 55 to 65% ejection fraction.  5. In 1964, thyroid nodule removed, benign.  6. In 2007, pubovaginal sling and cystocele repair.  7. In 1976, a hysterectomy and left BSO.  8. In 1976, back and cervical surgery.  9. Breast biopsy negative but showed fibrocystic disease.  10.In 2005, MRI of neck showing degenerative disk disease and bulging      disk in cervical region.  11.Mammogram, May 2007, negative.   FAMILY HISTORY:  Father deceased at age 65 with pneumonia.  Mother  deceased, at age 68 with diabetes.  She has a maternal grandmother and  maternal grandfather with diabetes.  She has a paternal grandfather with  coronary artery disease, although there is no family history with MI  less than age 7.  She is an only child.  She does have a daughter who  is diagnosed with breast cancer.   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  She has  been married for 61 years to the same  man.  She has 1 daughter who has  breast cancer, and one son who is overweight but otherwise healthy.  She  gets limited exercise secondary to peripheral neuropathy, but when she  is feeling good she tries to walk some.  She eats fruits, vegetables,  and drinks lots of water.   PHYSICAL EXAMINATION:  VITAL SIGNS:  Height 67 1/2 inches.  Weight 155.  Blood pressure 126/66, pulse 76, temperature 98.  GENERAL:  Elderly appearing female in no apparent distress.  HEENT:  PERRLA.  Extraocular muscles intact.  Oropharynx clear, tympanic  membranes clear, nares clear, no thyromegaly, no lymphadenopathy.  NEURO:  Alert and oriented x 3.  Cranial nerves II-XII grossly intact.  Decreased sensation in bilateral feet.  CARDIOVASCULAR:  Regular rate and rhythm.  No murmurs, rubs, or gallops.  Normal PMI, 2+ peripheral pulses.  No peripheral edema.  LUNGS:  Clear to auscultation bilaterally, no wheezes, rales, or  rhonchi.  ABDOMEN:  Soft, nontender, normoactive bowel sounds, no  hepatosplenomegaly.  MUSCULOSKELETAL:  Strength 5/5 on upper and lower extremities.   ASSESSMENT/PLAN:  1. Chronic pain and peripheral neuropathy:  She was given a      prescription today for a Fentanyl patch as well as Lorcet and      Lyrica.  The Fentanyl patch and Lorcet were given 3-month supply      with 0 refills.  The Lyrica was given 7-month supply with 3      refills.  I did discuss with her that if her chronic pain becomes      difficult to control, she will need to return to a chronic pain      center for adjustment of these heavy duty medications.  She also      states she is not having any problems as far as side effects from      these medications at this time.  2. Congestive heart failure, systolic, well-controlled:  She will      continue on Digoxin.  Digoxin, fosinopril, hydrochlorothiazide, as     well as aspirin.  A digoxin level was checked  in June 2007.  3. Hypertension, stable:  Her blood pressure  appears well-controlled,      and she will continue the above blood pressure medications.  4. Hypothyroidism, stable:  Thyroid stimulating hormone was recently      checked and will need to be done again next year.  She will      continue Synthroid 150 mcg daily.  5. Hypercholesterolemia, stable:  Last cholesterol panel was checked      in June 2007, and it was within normal control.  She will continue      on Lescol XL 80 mg daily.  We will recheck her cholesterol in 1      year, as well as LFTs.  6. Prevention:  She is up-to-date with mammogram.  She does not      require a Pap smear secondary to hysterectomy.  We did discuss      colon cancer screening.  At this point in time, she refuses      colonoscopy.  We will have her perform Hemoccult cards yearly.  She      received tetanus today.  She is otherwise up to date with pneumonia      and influenza vaccine.  She did come today with lots of old MD      records which I will review in detail.     Kerby Nora, MD  Electronically Signed    AB/MedQ  DD: 03/23/2006  DT: 03/24/2006  Job #: [id number]

## 2010-09-28 ENCOUNTER — Ambulatory Visit: Payer: Self-pay | Admitting: Geriatric Medicine

## 2010-09-30 ENCOUNTER — Encounter: Payer: Self-pay | Admitting: Internal Medicine

## 2011-01-12 LAB — POCT I-STAT 3, ART BLOOD GAS (G3+)
Bicarbonate: 27.4 — ABNORMAL HIGH
O2 Saturation: 95
pCO2 arterial: 41.2
pO2, Arterial: 76 — ABNORMAL LOW

## 2011-01-12 LAB — POCT I-STAT 3, VENOUS BLOOD GAS (G3P V)
Acid-Base Excess: 2
Bicarbonate: 27.9 — ABNORMAL HIGH
O2 Saturation: 65
O2 Saturation: 67
TCO2: 29
TCO2: 29
pCO2, Ven: 48.6
pH, Ven: 7.388 — ABNORMAL HIGH
pO2, Ven: 35

## 2011-01-18 ENCOUNTER — Ambulatory Visit: Payer: Self-pay | Admitting: Geriatric Medicine

## 2011-02-06 IMAGING — CR DG CHEST 1V PORT
1 series · 1 of 1 positions shown · non-contrast
Comparison: none

REASON FOR EXAM: line placement
COMMENTS:

[view not recorded]
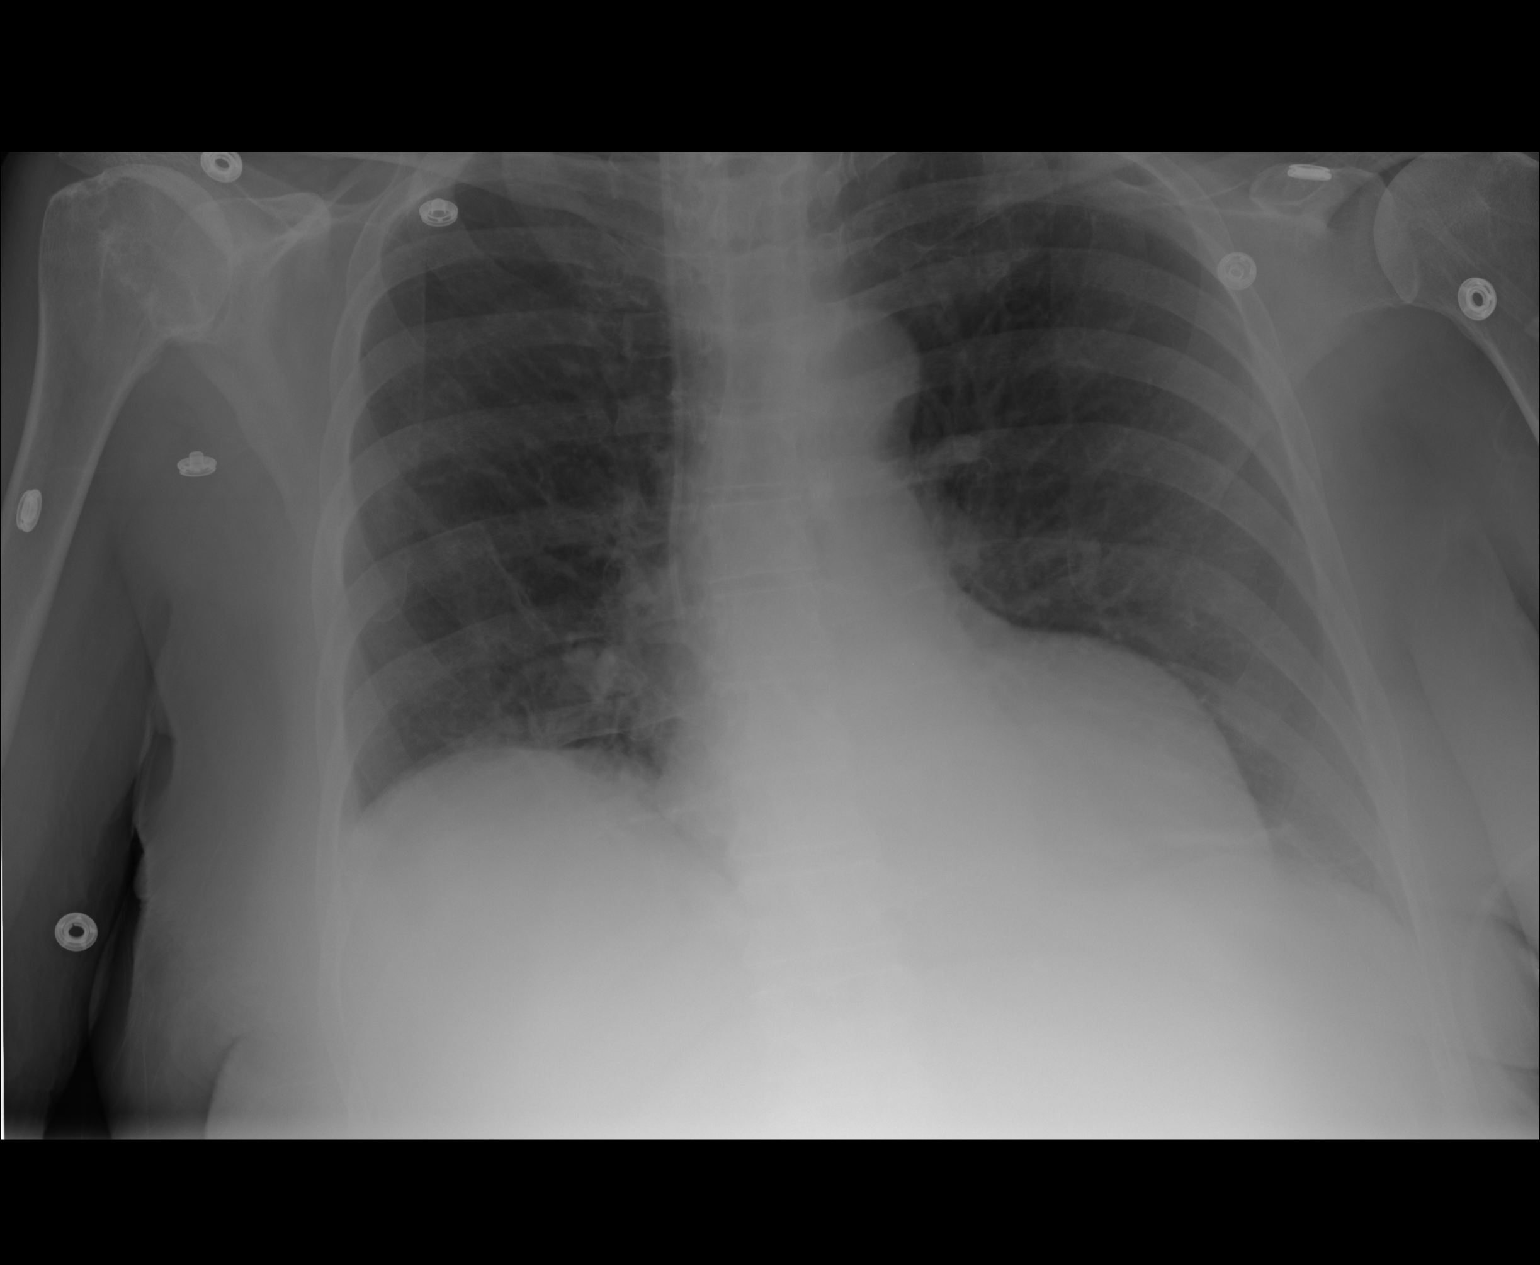

[1 of 1 positions shown; findings below may reference images not displayed]

PROCEDURE:     DXR - DXR PORTABLE CHEST SINGLE VIEW  - October 20, 2009  [DATE]

RESULT:     Comparison is made to study 07 October, 2009.

The lungs are adequately inflated. There is no focal infiltrate. The cardiac
silhouette is top normal in size. The pulmonary vascularity is not engorged.
There is a right internal jugular venous catheter whose tip lies in the
region of the mid to distal SVC. I see no post procedure complication.
IMPRESSION: I see no complication following placement of the right
internal jugular venous catheter.

## 2011-04-03 ENCOUNTER — Ambulatory Visit (INDEPENDENT_AMBULATORY_CARE_PROVIDER_SITE_OTHER): Payer: No Typology Code available for payment source | Admitting: Cardiovascular Disease

## 2011-04-03 ENCOUNTER — Encounter: Payer: Self-pay | Admitting: Cardiovascular Disease

## 2011-04-03 DIAGNOSIS — I4949 Other premature depolarization: Secondary | ICD-10-CM

## 2011-04-03 DIAGNOSIS — I428 Other cardiomyopathies: Secondary | ICD-10-CM

## 2011-04-03 DIAGNOSIS — I10 Essential (primary) hypertension: Secondary | ICD-10-CM

## 2011-04-03 DIAGNOSIS — I509 Heart failure, unspecified: Secondary | ICD-10-CM

## 2011-04-03 DIAGNOSIS — I498 Other specified cardiac arrhythmias: Secondary | ICD-10-CM

## 2011-04-03 NOTE — Progress Notes (Signed)
Katrina Jenkins Date of Birth  November 05, 1922 [facility] [address]    [address] [place], [place]  [address] [phone]  Fax  [phone]  History of Present Illness:  Katrina Jenkins is a 77 yo with hx of CHF, ( EF 20% - 30%).  She has had recent worsening DOE.  She has had episodes of bradycardia over the years   She notes slight worsening of her dyspnea over the past couple of months.  She also notes fatigue. Routine blood work has not found any specific abnormalities. No chest pain.  She has orthopnea and PND.  She sleeps in a hospital bed with the head raised.  She watches her salt  And uses KCl for salt substitute.  She has had a recent viral URI for hoarse voice.  She has seen ENT.  She walks with a walker.  She had a long hospitalization last year.  She was in the hospital for 4 months for small bowel obstruction and subsequent complications.  She has been very weak and has required lots of rehab since that hospitalization.  She and her husband now live at the [facility].    She was found to have bradycardia and her coreg was stopped.  She feels better now that she is off the coreg.  Current Outpatient Prescriptions on File Prior to Visit  Medication Sig Dispense Refill  . albuterol (PROAIR HFA) 108 (90 BASE) MCG/ACT inhaler Inhale 2 puffs into the lungs every 4 (four) hours as needed.        Marland Kitchen aspirin (BAYER CHILDRENS ASPIRIN) 81 MG chewable tablet Chew 81 mg by mouth daily.        . Calcium Carbonate-Vitamin D (TH CALCIUM CARBONATE-VITAMIN D) 600-400 MG-UNIT per tablet Take 1 tablet by mouth daily.        Marland Kitchen docusate sodium (COLACE) 100 MG capsule Take 200 mg by mouth daily.        . Eszopiclone (ESZOPICLONE) 3 MG TABS Take 3 mg by mouth at bedtime as needed. Take immediately before bedtime       . fentaNYL (DURAGESIC - DOSED MCG/HR) 100 MCG/HR Place 1 patch onto the skin every other day.        . flunisolide (NASAREL) 29 MCG/ACT (0.025%) nasal spray Place 2 sprays into the  nose as needed. Dose is for each nostril.       . Fluticasone-Salmeterol (ADVAIR DISKUS) 250-50 MCG/DOSE AEPB Inhale 2 puffs into the lungs 2 (two) times daily.        . furosemide (LASIX) 80 MG tablet Take 80 mg by mouth daily.        Marland Kitchen HYDROcodone-acetaminophen (LORCET) 10-650 MG per tablet Take 1 tablet by mouth as needed.        . pregabalin (LYRICA) 100 MG capsule Take 100 mg by mouth 2 (two) times daily.        Marland Kitchen senna (SENOKOT) 8.6 MG tablet Take 2 tablets by mouth daily.        . vitamin B-12 (CYANOCOBALAMIN) 500 MCG tablet Take 500 mcg by mouth daily.        . Multiple Vitamin (MULTIVITAMIN) tablet Take 1 tablet by mouth daily.          Allergies  Allergen Reactions  . Levofloxacin     unknown     Past Medical History  Diagnosis Date  . CHF (congestive heart failure)     Due to nonischemic CM (?PVC related); EF 20-30% by ECHO 2/10.  Mild MR (echo 9/09 35-40%); Minimal non-obstructive CAD by cath 6/09  . PVC's (premature ventricular contractions)     Frequent; Previosly on Amiodarone to suppress. Limited by tremors; Discussed ablation and CRT with Dr. Graciela Husbands but decided to defer  . Hypertension   . Hyperlipidemia   . Hypothyroidism   . Asthma   . Allergic rhinitis   . Osteoarthritis   . Congenital absence of ear     Right  . Peripheral neuropathy     Unknown etiology. Wears a Fentanyl patch.  . History of alcohol abuse     Quit long time ago  . Foot ulcer     Right  . Hernia     Past Surgical History  Procedure Date  . Doppler echocardiography 04/2006    25-30%    History  Smoking status  . Never Smoker   Smokeless tobacco  . Not on file    History  Alcohol Use No    Family History  Problem Relation Age of Onset  . Diabetes Mother   . Diabetes Maternal Grandmother   . Diabetes Maternal Grandfather   . Heart attack Neg Hx     No MI <65  . Breast cancer Daughter     Reviw of Systems:  Reviewed in the HPI.  All other systems are  negative.  Physical Exam: BP 100/60  Pulse 58  Ht 5\' 8"  (1.727 m)  Wt 166 lb (75.297 kg)  BMI 25.24 kg/m2 The patient is alert and oriented x 3.  The mood and affect are normal.   Skin: warm and dry.  Color is normal.    HEENT:   Normocephalic/atraumatic. She is normal carotids. There is no JVD.  Lungs: Lungs are clear to auscultation.   Heart: regularly irregular is a bigeminal pattern.  Soft murmur  Abdomen: Is good bowel sounds. There is no hepatosplenomegaly.  Extremities:  No clubbing cyanosis or edema.  Neuro:  There exam is nonfocal.    ECG: Sinus bradycardia with frequent premature ventricular contractions in a bigeminal pattern. She has a right bundle branch block with left axis deviation. There is a possible old anterior wall myocardial infarction  Assessment / Plan:

## 2011-04-03 NOTE — Assessment & Plan Note (Signed)
Katrina Jenkins carries a diagnosis of dilated cardiomyopathy. There is some thought that this was due to her frequent premature ventricular contractions.   I don' records from centricity.t have the old She was on carvedilol but she now feels quite a bit better off the carvedilol.  At this point I would like to do an echocardiogram for further evaluation of her left ventricular systolic function. Her last EF was between 20 and 30%.  We will have her return to see Dr. Graciela Husbands in several weeks. It is quite possible that she needs a pacemaker or she may need ablation of her PVCs.  She is quite symptomatic and is quite fatigued with her bradycardia and PVCs.

## 2011-04-03 NOTE — Patient Instructions (Signed)
Your physician has requested that you have an echocardiogram. Echocardiography is a painless test that uses sound waves to create images of your heart. It provides your doctor with information about the size and shape of your heart and how well your heart's chambers and valves are working. This procedure takes approximately one hour. There are no restrictions for this procedure.  Your physician recommends that you schedule a follow-up appointment with Dr. Graciela Husbands for Pacemaker evaluation: 04/27/11 @ 4:00PM  Your physician recommends that you schedule a follow-up appointment in: 2 months with Dr. Elease Hashimoto

## 2011-04-25 ENCOUNTER — Other Ambulatory Visit (INDEPENDENT_AMBULATORY_CARE_PROVIDER_SITE_OTHER): Payer: No Typology Code available for payment source | Admitting: *Deleted

## 2011-04-25 DIAGNOSIS — I379 Nonrheumatic pulmonary valve disorder, unspecified: Secondary | ICD-10-CM

## 2011-04-25 DIAGNOSIS — I509 Heart failure, unspecified: Secondary | ICD-10-CM

## 2011-04-25 DIAGNOSIS — I428 Other cardiomyopathies: Secondary | ICD-10-CM

## 2011-04-25 DIAGNOSIS — I369 Nonrheumatic tricuspid valve disorder, unspecified: Secondary | ICD-10-CM

## 2011-04-27 ENCOUNTER — Encounter: Payer: Self-pay | Admitting: Internal Medicine

## 2011-04-27 ENCOUNTER — Ambulatory Visit (INDEPENDENT_AMBULATORY_CARE_PROVIDER_SITE_OTHER): Payer: No Typology Code available for payment source | Admitting: Internal Medicine

## 2011-04-27 VITALS — BP 118/50 | HR 42 | Ht 68.0 in | Wt 163.0 lb

## 2011-04-27 DIAGNOSIS — Z01810 Encounter for preprocedural cardiovascular examination: Secondary | ICD-10-CM

## 2011-04-27 DIAGNOSIS — I4430 Unspecified atrioventricular block: Secondary | ICD-10-CM

## 2011-04-27 DIAGNOSIS — I428 Other cardiomyopathies: Secondary | ICD-10-CM

## 2011-04-27 DIAGNOSIS — I498 Other specified cardiac arrhythmias: Secondary | ICD-10-CM

## 2011-04-27 NOTE — Assessment & Plan Note (Signed)
Somewhat surprisingly this has resolved. This is a good thing

## 2011-04-27 NOTE — Assessment & Plan Note (Addendum)
The patient has symptomatic bradycardia as a consequence of 2:1 AV block as well as interpolated PVCs. Functional rates ranging the high 20s to low 40s. She has had chronic dyspnea on exertion but I think at this point it seems reasonable to proceed with dual-chamber pacing. The fact that her LV function has improved makes the Decision regarding CRT much easier and we will proceed as above.  We have discussed potential benefits and risks including but not limited to perforation lead dislodgment and infection and they're willing to proceed.  Pacing may well help overdrive suppress the PVCs.  Another issue that we discussed was the limitations following pacemaker implantation as relates to her left arm and the use of her walker. Perhaps aN elbow supporting fixture might be of additional benefit  She also hopes to have surgery on her abdominal wall hernia. I think it is unlikely that anesthesia would be willing to put her to sleep with 2-1 heart block  We discussed the procedure being done a [facility] versus [facility]. There are very willing to accommodate this being done at [facility]

## 2011-04-27 NOTE — Progress Notes (Signed)
CARDIOLOGY CONSULT NOTE  Patient ID: Katrina Jenkins, MRN: [id number], DOB/AGE: [birth year] 78 y.o. Admit date: (Not on file) Date of Consult: 04/27/2011  Primary Physician: Florentina Jenny, MD Primary Cardiologist: nahser  Chief Complaint: Bradycardia HPI: The patient is seen at the request of Dr. Elease Hashimoto because of bradycardia.  This was identified at the nursing facility where she is a resident with heart rate detected in the high 30s and low 40s. She has noted dyspnea on exertion and fatigue. She has a past history notable for a cardiomyopathy attributed in part to PVCs. Her ejection fraction had been 20-30% and there is no evidence of coronary artery disease. Notably an echo last week demonstrated ejection fraction of 50%.  In the past when she was seen, she was noted to have a heart rate in the mid 40s and low 50s and was without significant symptoms. This is sinus rhythm.  When she was seen by Dr. Jamse Mead in December bradycardia was noted. Electrocardiogram demonstrates 2-1 AV block further complicated by interpolated PVCs in a pattern of bigeminy resulting in a functional heart rate in the high 20s.  She has noted some problems with peripheral edema but no chest pain. [name] had no syncope.  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 CHF (congestive heart failure)     Due to nonischemic CM (?PVC related); EF 20-30% by ECHO 2/10. Mild MR (echo 9/09 35-40%); Minimal non-obstructive CAD by cath 6/09  . PVC's (premature ventricular contractions)     Frequent; Previosly on Amiodarone to suppress. Limited by tremors; Discussed ablation and CRT with Dr. Graciela Husbands but decided to defer  . Hypertension   . Hyperlipidemia   . Hypothyroidism   . Asthma   . Allergic rhinitis   . Osteoarthritis   . Congenital absence of  ear     Right  . Peripheral neuropathy     Unknown etiology. Wears a Fentanyl patch.  . History of alcohol abuse     Quit long time ago  . Foot ulcer     Right  . Hernia   . Hernia       Surgical History:  Past Surgical History  Procedure Date  . Doppler echocardiography 04/2006    25-30%     Home Meds: Prior to Admission medications   Medication Sig Start Date End Date Taking? Authorizing Provider  albuterol (PROAIR HFA) 108 (90 BASE) MCG/ACT inhaler Inhale 2 puffs into the lungs every 4 (four) hours as needed.     Yes Historical Provider, MD  aspirin (BAYER CHILDRENS ASPIRIN) 81 MG chewable tablet Chew 81 mg by mouth daily.     Yes Historical Provider, MD  bisacodyl (DULCOLAX) 5 MG EC tablet Take 5 mg by mouth daily as needed.     Yes Historical Provider, MD  Calcium Carbonate-Vitamin D (TH CALCIUM CARBONATE-VITAMIN D) 600-400 MG-UNIT per tablet Take 1 tablet by mouth daily.     Yes Historical Provider, MD  docusate sodium (COLACE) 100 MG capsule Take 200 mg by mouth daily.     Yes Historical Provider, MD  Eszopiclone (ESZOPICLONE) 3 MG TABS Take 3 mg by mouth at bedtime as needed. Take immediately before bedtime    Yes Historical Provider, MD  fentaNYL (DURAGESIC - DOSED MCG/HR) 100 MCG/HR Place 1 patch onto  the skin every other day.     Yes Historical Provider, MD  flunisolide (NASAREL) 29 MCG/ACT (0.025%) nasal spray Place 2 sprays into the nose as needed. Dose is for each nostril.    Yes Historical Provider, MD  Fluticasone-Salmeterol (ADVAIR DISKUS) 250-50 MCG/DOSE AEPB Inhale 2 puffs into the lungs 2 (two) times daily.     Yes Historical Provider, MD  furosemide (LASIX) 80 MG tablet Take 80 mg by mouth daily.     Yes Historical Provider, MD  guaiFENesin (MUCINEX) 600 MG 12 hr tablet Take one and half tabley by mouth bid for 10 days.    Yes Historical Provider, MD  HYDROcodone-acetaminophen (LORCET) 10-650 MG per tablet Take 1 tablet by mouth as needed.     Yes Historical  Provider, MD  levothyroxine (SYNTHROID, LEVOTHROID) 100 MCG tablet Take 100 mcg by mouth daily.     Yes Historical Provider, MD  Multiple Vitamin (MULTIVITAMIN) tablet Take 1 tablet by mouth daily.     Yes Historical Provider, MD  potassium chloride (KLOR-CON) 20 MEQ packet Take 20 mEq by mouth 2 (two) times daily.     Yes Historical Provider, MD  pregabalin (LYRICA) 100 MG capsule Take 100 mg by mouth 2 (two) times daily.     Yes Historical Provider, MD  senna (SENOKOT) 8.6 MG tablet Take 2 tablets by mouth daily.     Yes Historical Provider, MD  simvastatin (ZOCOR) 20 MG tablet Take 20 mg by mouth at bedtime.     Yes Historical Provider, MD  traZODone (DESYREL) 50 MG tablet Take 50 mg by mouth at bedtime.     Yes Historical Provider, MD  vitamin B-12 (CYANOCOBALAMIN) 500 MCG tablet Take 500 mcg by mouth daily.     Yes Historical Provider, MD     Allergies:  Allergies  Allergen Reactions  . Levofloxacin     unknown     History   Social History  . Marital Status: Married    Spouse Name: N/A    Number of Children: N/A  . Years of Education: N/A   Occupational History  . Homemaker    Social History Main Topics  . Smoking status: Never Smoker   . Smokeless tobacco: Not on file  . Alcohol Use: No  . Drug Use: No  . Sexually Active: Not on file   Other Topics Concern  . Not on file   Social History Narrative   Married for 55 years1 daughter with breast cancer, 1 son overweightGets regular exercise, but limited due to peripheral neuropathyDiet: Fruit and veggies, H2O     Family History  Problem Relation Age of Onset  . Diabetes Mother   . Diabetes Maternal Grandmother   . Diabetes Maternal Grandfather   . Heart attack Neg Hx     No MI <65  . Breast cancer Daughter      Review of Systems:  General: negative for chills, fever, night sweats or weight changes.  Cardiovascular: negative for chest pain, edema, orthopnea, palpitations, paroxysmal nocturnal dyspnea,  shortness of breath or dyspnea on exertion Dermatological: negative for rash Respiratory: negative for cough or wheezing Urologic: negative for hematuria Abdominal: negative for nausea, vomiting, diarrhea, bright red blood per rectum, melena, or hematemesis Neurologic: negative for visual changes, syncope, or dizziness All other systems reviewed and are otherwise negative except as noted above.          Physical Exam:  Blood pressure 118/50, pulse 42, height 5\' 8"  (1.727 m), weight 163 lb (  73.936 kg). General: Well developed, well nourished, in no acute distress. Head: Normocephalic, atraumatic, sclera non-icteric, no xanthomas, nares are without discharge. Lymph Nodes:  none Neck: Negative for carotid bruits. JVD not elevated. Lungs: Clear bilaterally to auscultation without wheezes, rales, or rhonchi. Breathing is unlabored. Heart: Slow but regular rate and rhythm. No murmurs, rubs, or gallops appreciated. Abdomen: Soft, non-tender, non-distended with normoactive bowel sounds. No hepatomegaly. No rebound/guarding. No obvious abdominal masses. Msk:  Strength and tone appear normal for age. Back: Moderate scoliosis Extremities: No clubbing or cyanosis. Trace edema.  Distal pedal pulses are 2+ and equal bilaterally. Skin: Warm and Dry Neuro: Alert and oriented X 3. CN III-XII intact wide based gait as a consequence of her neuropathy  Psych:  Responds to questions appropriately with a normal affect.      Labs:  EKG: ECG today demonstrates sinus rhythm with 2 to one conduction she has right bundle branch block with left axis deviation and a possible inferior wall MI there is also suggestion of her prior anterior wall MI  ECG from 1217 demonstrates sinus rhythm with 2 to one conduction with interpolated PVCs   Assessment and Plan:    Katrina Jenkins

## 2011-04-28 ENCOUNTER — Encounter: Payer: Self-pay | Admitting: Internal Medicine

## 2011-04-28 ENCOUNTER — Encounter: Payer: Self-pay | Admitting: *Deleted

## 2011-05-02 ENCOUNTER — Encounter (HOSPITAL_COMMUNITY): Payer: Self-pay | Admitting: Respiratory Therapy

## 2011-05-02 ENCOUNTER — Other Ambulatory Visit: Payer: Self-pay | Admitting: Internal Medicine

## 2011-05-04 ENCOUNTER — Ambulatory Visit (INDEPENDENT_AMBULATORY_CARE_PROVIDER_SITE_OTHER): Payer: No Typology Code available for payment source | Admitting: *Deleted

## 2011-05-04 DIAGNOSIS — I498 Other specified cardiac arrhythmias: Secondary | ICD-10-CM

## 2011-05-04 DIAGNOSIS — I428 Other cardiomyopathies: Secondary | ICD-10-CM

## 2011-05-04 DIAGNOSIS — Z01810 Encounter for preprocedural cardiovascular examination: Secondary | ICD-10-CM

## 2011-05-04 DIAGNOSIS — I4430 Unspecified atrioventricular block: Secondary | ICD-10-CM

## 2011-05-05 LAB — CBC WITH DIFFERENTIAL
Basos: 0 % (ref 0–3)
Eos: 3 % (ref 0–7)
HCT: 31.5 % — ABNORMAL LOW (ref 34.0–46.6)
Hemoglobin: 10.5 g/dL — ABNORMAL LOW (ref 11.1–15.9)
Immature Grans (Abs): 0 10*3/uL (ref 0.0–0.1)
Lymphocytes Absolute: 1.8 10*3/uL (ref 0.7–4.5)
MCH: 26.6 pg (ref 26.6–33.0)
MCHC: 33.3 g/dL (ref 31.5–35.7)
MCV: 80 fL (ref 79–97)
Monocytes Absolute: 0.8 10*3/uL (ref 0.1–1.0)
Neutrophils Absolute: 5 10*3/uL (ref 1.8–7.8)
RBC: 3.94 x10E6/uL (ref 3.77–5.28)

## 2011-05-05 LAB — BASIC METABOLIC PANEL
BUN/Creatinine Ratio: 18 (ref 11–26)
BUN: 23 mg/dL (ref 8–27)
Creatinine, Ser: 1.26 mg/dL — ABNORMAL HIGH (ref 0.57–1.00)
GFR calc Af Amer: 45 mL/min/{1.73_m2} — ABNORMAL LOW (ref >59)
Sodium: 138 mmol/L (ref 134–144)

## 2011-05-07 MED ORDER — SODIUM CHLORIDE 0.9 % IR SOLN
80.0000 mg | Status: DC
  Filled 2011-05-07 (×2): qty 2

## 2011-05-07 MED ORDER — CEFAZOLIN SODIUM 1-5 GM-% IV SOLN: 1.0000 g | INTRAVENOUS | Status: DC

## 2011-05-08 ENCOUNTER — Ambulatory Visit (HOSPITAL_COMMUNITY)
Admission: RE | Admit: 2011-05-08 | Discharge: 2011-05-09 | Disposition: A | Discharge status: Home / Self Care | Payer: No Typology Code available for payment source | Source: Ambulatory Visit | Attending: Internal Medicine | Admitting: Internal Medicine

## 2011-05-08 ENCOUNTER — Encounter (HOSPITAL_COMMUNITY)
Admission: RE | Disposition: A | Discharge status: Home / Self Care | Payer: Self-pay | Source: Ambulatory Visit | Attending: Internal Medicine

## 2011-05-08 ENCOUNTER — Ambulatory Visit (HOSPITAL_COMMUNITY): Payer: No Typology Code available for payment source

## 2011-05-08 ENCOUNTER — Encounter (HOSPITAL_COMMUNITY): Payer: Self-pay | Admitting: General Practice

## 2011-05-08 DIAGNOSIS — I441 Atrioventricular block, second degree: Secondary | ICD-10-CM | POA: Insufficient documentation

## 2011-05-08 DIAGNOSIS — R0602 Shortness of breath: Secondary | ICD-10-CM

## 2011-05-08 DIAGNOSIS — M545 Low back pain, unspecified: Secondary | ICD-10-CM | POA: Insufficient documentation

## 2011-05-08 DIAGNOSIS — I509 Heart failure, unspecified: Secondary | ICD-10-CM | POA: Insufficient documentation

## 2011-05-08 DIAGNOSIS — I428 Other cardiomyopathies: Secondary | ICD-10-CM | POA: Insufficient documentation

## 2011-05-08 DIAGNOSIS — E785 Hyperlipidemia, unspecified: Secondary | ICD-10-CM | POA: Insufficient documentation

## 2011-05-08 DIAGNOSIS — I4430 Unspecified atrioventricular block: Secondary | ICD-10-CM | POA: Insufficient documentation

## 2011-05-08 DIAGNOSIS — E039 Hypothyroidism, unspecified: Secondary | ICD-10-CM | POA: Insufficient documentation

## 2011-05-08 DIAGNOSIS — J449 Chronic obstructive pulmonary disease, unspecified: Secondary | ICD-10-CM | POA: Insufficient documentation

## 2011-05-08 DIAGNOSIS — I10 Essential (primary) hypertension: Secondary | ICD-10-CM | POA: Insufficient documentation

## 2011-05-08 DIAGNOSIS — J4489 Other specified chronic obstructive pulmonary disease: Secondary | ICD-10-CM | POA: Insufficient documentation

## 2011-05-08 DIAGNOSIS — I498 Other specified cardiac arrhythmias: Secondary | ICD-10-CM | POA: Insufficient documentation

## 2011-05-08 DIAGNOSIS — M1990 Unspecified osteoarthritis, unspecified site: Secondary | ICD-10-CM | POA: Insufficient documentation

## 2011-05-08 DIAGNOSIS — G609 Hereditary and idiopathic neuropathy, unspecified: Secondary | ICD-10-CM | POA: Insufficient documentation

## 2011-05-08 DIAGNOSIS — I5032 Chronic diastolic (congestive) heart failure: Secondary | ICD-10-CM | POA: Insufficient documentation

## 2011-05-08 HISTORY — DX: Shortness of breath: R06.02

## 2011-05-08 HISTORY — DX: Chronic obstructive pulmonary disease, unspecified: J44.9

## 2011-05-08 HISTORY — DX: Low back pain, unspecified: M54.50

## 2011-05-08 HISTORY — DX: Pneumonia, unspecified organism: J18.9

## 2011-05-08 HISTORY — PX: PERMANENT PACEMAKER INSERTION: SHX5480

## 2011-05-08 HISTORY — DX: Other chronic pain: G89.29

## 2011-05-08 HISTORY — PX: INSERT / REPLACE / REMOVE PACEMAKER: SUR710

## 2011-05-08 HISTORY — DX: Low back pain: M54.5

## 2011-05-08 LAB — SURGICAL PCR SCREEN
MRSA, PCR: NEGATIVE
Staphylococcus aureus: NEGATIVE

## 2011-05-08 SURGERY — PERMANENT PACEMAKER INSERTION
Anesthesia: LOCAL

## 2011-05-08 MED ORDER — PREGABALIN 25 MG PO CAPS
100.0000 mg | ORAL_CAPSULE | Freq: Three times a day (TID) | ORAL | Status: DC
  Administered 2011-05-08 – 2011-05-09 (×2): 100 mg via ORAL
  Filled 2011-05-08: qty 3
  Filled 2011-05-08: qty 1
  Filled 2011-05-08: qty 4

## 2011-05-08 MED ORDER — LIDOCAINE HCL (PF) 1 % IJ SOLN
INTRAMUSCULAR | Status: AC
  Filled 2011-05-08: qty 60

## 2011-05-08 MED ORDER — POTASSIUM CHLORIDE CRYS ER 20 MEQ PO TBCR
20.0000 meq | EXTENDED_RELEASE_TABLET | Freq: Every day | ORAL | Status: DC
  Administered 2011-05-09: 20 meq via ORAL
  Filled 2011-05-08: qty 1

## 2011-05-08 MED ORDER — SENNA 8.6 MG PO TABS
1.0000 | ORAL_TABLET | Freq: Two times a day (BID) | ORAL | Status: DC
  Administered 2011-05-09: 8.6 mg via ORAL
  Filled 2011-05-08 (×3): qty 1

## 2011-05-08 MED ORDER — CEFAZOLIN SODIUM 1-5 GM-% IV SOLN
INTRAVENOUS | Status: AC
  Filled 2011-05-08: qty 50

## 2011-05-08 MED ORDER — BISACODYL 5 MG PO TBEC
10.0000 mg | DELAYED_RELEASE_TABLET | Freq: Every day | ORAL | Status: DC
  Administered 2011-05-08: 10 mg via ORAL
  Filled 2011-05-08: qty 2

## 2011-05-08 MED ORDER — OXYCODONE HCL 5 MG PO TABS: 5.0000 mg | ORAL_TABLET | ORAL | Status: DC | PRN

## 2011-05-08 MED ORDER — LEVOTHYROXINE SODIUM 100 MCG PO TABS
100.0000 ug | ORAL_TABLET | Freq: Every day | ORAL | Status: DC
  Administered 2011-05-09: 100 ug via ORAL
  Filled 2011-05-08: qty 1

## 2011-05-08 MED ORDER — CYANOCOBALAMIN 500 MCG PO TABS
500.0000 ug | ORAL_TABLET | Freq: Every day | ORAL | Status: DC
  Administered 2011-05-09: 500 ug via ORAL
  Filled 2011-05-08: qty 1

## 2011-05-08 MED ORDER — SENNOSIDES 8.6 MG PO TABS: 1.0000 | ORAL_TABLET | Freq: Two times a day (BID) | ORAL | Status: DC

## 2011-05-08 MED ORDER — GUAIFENESIN ER 600 MG PO TB12
600.0000 mg | ORAL_TABLET | Freq: Two times a day (BID) | ORAL | Status: DC
  Administered 2011-05-08 – 2011-05-09 (×2): 600 mg via ORAL
  Filled 2011-05-08 (×3): qty 1

## 2011-05-08 MED ORDER — SODIUM CHLORIDE 0.45 % IV SOLN
INTRAVENOUS | Status: AC
  Administered 2011-05-08: 19:00:00 via INTRAVENOUS

## 2011-05-08 MED ORDER — CEFAZOLIN SODIUM 1-5 GM-% IV SOLN
1.0000 g | Freq: Four times a day (QID) | INTRAVENOUS | Status: AC
  Administered 2011-05-09 (×3): 1 g via INTRAVENOUS
  Filled 2011-05-08 (×3): qty 50

## 2011-05-08 MED ORDER — ALBUTEROL SULFATE HFA 108 (90 BASE) MCG/ACT IN AERS
2.0000 | INHALATION_SPRAY | RESPIRATORY_TRACT | Status: DC | PRN
  Filled 2011-05-08: qty 6.7

## 2011-05-08 MED ORDER — FUROSEMIDE 80 MG PO TABS
80.0000 mg | ORAL_TABLET | Freq: Every day | ORAL | Status: DC
  Administered 2011-05-09: 80 mg via ORAL
  Filled 2011-05-08: qty 1

## 2011-05-08 MED ORDER — HYDROCODONE-ACETAMINOPHEN 5-325 MG PO TABS: 1.0000 | ORAL_TABLET | ORAL | Status: DC | PRN

## 2011-05-08 MED ORDER — ALBUTEROL SULFATE (5 MG/ML) 0.5% IN NEBU: 2.5000 mg | INHALATION_SOLUTION | Freq: Four times a day (QID) | RESPIRATORY_TRACT | Status: DC

## 2011-05-08 MED ORDER — SALINE SPRAY 0.65 % NA SOLN
1.0000 | NASAL | Status: DC | PRN
  Filled 2011-05-08: qty 44

## 2011-05-08 MED ORDER — MUPIROCIN 2 % EX OINT
TOPICAL_OINTMENT | Freq: Once | CUTANEOUS | Status: AC
  Administered 2011-05-08: 1 via NASAL

## 2011-05-08 MED ORDER — CHLORHEXIDINE GLUCONATE 4 % EX LIQD: 60.0000 mL | Freq: Once | CUTANEOUS | Status: DC

## 2011-05-08 MED ORDER — FLUTICASONE PROPIONATE 50 MCG/ACT NA SUSP: 1.0000 | Freq: Every day | NASAL | Status: DC

## 2011-05-08 MED ORDER — SIMVASTATIN 20 MG PO TABS
20.0000 mg | ORAL_TABLET | Freq: Every day | ORAL | Status: DC
  Administered 2011-05-08: 20 mg via ORAL
  Filled 2011-05-08 (×2): qty 1

## 2011-05-08 MED ORDER — TRAZODONE HCL 50 MG PO TABS
50.0000 mg | ORAL_TABLET | Freq: Every day | ORAL | Status: DC
  Administered 2011-05-08: 50 mg via ORAL
  Filled 2011-05-08 (×2): qty 1

## 2011-05-08 MED ORDER — FENTANYL 100 MCG/HR TD PT72: 100.0000 ug | MEDICATED_PATCH | TRANSDERMAL | Status: DC

## 2011-05-08 MED ORDER — FLUTICASONE PROPIONATE 50 MCG/ACT NA SUSP
2.0000 | Freq: Every day | NASAL | Status: DC
  Administered 2011-05-09: 2 via NASAL
  Filled 2011-05-08: qty 16

## 2011-05-08 MED ORDER — ONDANSETRON HCL 4 MG/2ML IJ SOLN: 4.0000 mg | Freq: Four times a day (QID) | INTRAMUSCULAR | Status: DC | PRN

## 2011-05-08 MED ORDER — MUPIROCIN 2 % EX OINT
TOPICAL_OINTMENT | CUTANEOUS | Status: AC
  Filled 2011-05-08: qty 22

## 2011-05-08 MED ORDER — ACETAMINOPHEN 325 MG PO TABS: 325.0000 mg | ORAL_TABLET | ORAL | Status: DC | PRN

## 2011-05-08 MED ORDER — VITAMIN D3 25 MCG (1000 UNIT) PO TABS
2000.0000 [IU] | ORAL_TABLET | Freq: Every day | ORAL | Status: DC
  Administered 2011-05-09: 2000 [IU] via ORAL
  Filled 2011-05-08: qty 2

## 2011-05-08 MED ORDER — SALINE NASAL SPRAY 0.65 % NA SOLN: 1.0000 | NASAL | Status: DC | PRN

## 2011-05-08 MED ORDER — SODIUM CHLORIDE 0.9 % IV SOLN
INTRAVENOUS | Status: DC
  Administered 2011-05-08: 50 mL/h via INTRAVENOUS

## 2011-05-08 MED ORDER — ASPIRIN 81 MG PO CHEW
81.0000 mg | CHEWABLE_TABLET | Freq: Every day | ORAL | Status: DC
  Administered 2011-05-09: 81 mg via ORAL
  Filled 2011-05-08: qty 1

## 2011-05-08 MED ORDER — YOU HAVE A PACEMAKER BOOK
Freq: Once | Status: AC
  Administered 2011-05-08: 22:00:00
  Filled 2011-05-08: qty 1

## 2011-05-08 MED ORDER — ACETAMINOPHEN 325 MG PO TABS
650.0000 mg | ORAL_TABLET | Freq: Four times a day (QID) | ORAL | Status: DC | PRN
  Administered 2011-05-08: 650 mg via ORAL
  Filled 2011-05-08: qty 1
  Filled 2011-05-08: qty 2

## 2011-05-08 MED ORDER — SODIUM CHLORIDE 0.45 % IV SOLN
INTRAVENOUS | Status: DC
  Administered 2011-05-08: 50 mL/h via INTRAVENOUS

## 2011-05-08 NOTE — Plan of Care (Signed)
Problem: Phase II Progression Outcomes Goal: Pacer site without bleeding/hematoma Outcome: Progressing Some shadowing on dressing. Will continue to assess.

## 2011-05-08 NOTE — Interval H&P Note (Signed)
History and Physical Interval Note:  05/08/2011 12:25 PM  Katrina Jenkins  has presented today for surgery, with the diagnosis of av block  The various methods of treatment have been discussed with the patient and family. After consideration of risks, benefits and other options for treatment, the patient has consented to  Procedure(s): PERMANENT PACEMAKER INSERTION as a surgical intervention .  The patients' history has been reviewed, patient examined, no change in status, stable for surgery.  I have reviewed the patients' chart and labs.  Questions were answered to the patient's satisfaction.     Sherryl Manges  Bronchitis is much much better

## 2011-05-08 NOTE — H&P (View-Only) (Signed)
 CARDIOLOGY CONSULT NOTE  Patient ID: Katrina Jenkins, MRN: [id number], DOB/AGE: 06/20/1922 78 y.o. Admit date: (Not on file) Date of Consult: 04/27/2011  Primary Physician: TRIPP,HENRY, MD Primary Cardiologist: nahser  Chief Complaint: Bradycardia HPI: The patient is seen at the request of Dr. Nahser because of bradycardia.  This was identified at the nursing facility where she is a resident with heart rate detected in the high 30s and low 40s. She has noted dyspnea on exertion and fatigue. She has a past history notable for a cardiomyopathy attributed in part to PVCs. Her ejection fraction had been 20-30% and there is no evidence of coronary artery disease. Notably an echo last week demonstrated ejection fraction of 50%.  In the past when she was seen, she was noted to have a heart rate in the mid 40s and low 50s and was without significant symptoms. This is sinus rhythm.  When she was seen by Dr. PN in December bradycardia was noted. Electrocardiogram demonstrates 2-1 AV block further complicated by interpolated PVCs in a pattern of bigeminy resulting in a functional heart rate in the high 20s.  She has noted some problems with peripheral edema but no chest pain. [name] had no syncope.  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 CHF (congestive heart failure)     Due to nonischemic CM (?PVC related); EF 20-30% by ECHO 2/10. Mild MR (echo 9/09 35-40%); Minimal non-obstructive CAD by cath 6/09  . PVC's (premature ventricular contractions)     Frequent; Previosly on Amiodarone to suppress. Limited by tremors; Discussed ablation and CRT with Dr. Syris Brookens but decided to defer  . Hypertension   . Hyperlipidemia   . Hypothyroidism   . Asthma   . Allergic rhinitis   . Osteoarthritis   . Congenital absence of  ear     Right  . Peripheral neuropathy     Unknown etiology. Wears a Fentanyl patch.  . History of alcohol abuse     Quit long time ago  . Foot ulcer     Right  . Hernia   . Hernia       Surgical History:  Past Surgical History  Procedure Date  . Doppler echocardiography 04/2006    25-30%     Home Meds: Prior to Admission medications   Medication Sig Start Date End Date Taking? Authorizing Provider  albuterol (PROAIR HFA) 108 (90 BASE) MCG/ACT inhaler Inhale 2 puffs into the lungs every 4 (four) hours as needed.     Yes Historical Provider, MD  aspirin (BAYER CHILDRENS ASPIRIN) 81 MG chewable tablet Chew 81 mg by mouth daily.     Yes Historical Provider, MD  bisacodyl (DULCOLAX) 5 MG EC tablet Take 5 mg by mouth daily as needed.     Yes Historical Provider, MD  Calcium Carbonate-Vitamin D (TH CALCIUM CARBONATE-VITAMIN D) 600-400 MG-UNIT per tablet Take 1 tablet by mouth daily.     Yes Historical Provider, MD  docusate sodium (COLACE) 100 MG capsule Take 200 mg by mouth daily.     Yes Historical Provider, MD  Eszopiclone (ESZOPICLONE) 3 MG TABS Take 3 mg by mouth at bedtime as needed. Take immediately before bedtime    Yes Historical Provider, MD  fentaNYL (DURAGESIC - DOSED MCG/HR) 100 MCG/HR Place 1 patch onto   the skin every other day.     Yes Historical Provider, MD  flunisolide (NASAREL) 29 MCG/ACT (0.025%) nasal spray Place 2 sprays into the nose as needed. Dose is for each nostril.    Yes Historical Provider, MD  Fluticasone-Salmeterol (ADVAIR DISKUS) 250-50 MCG/DOSE AEPB Inhale 2 puffs into the lungs 2 (two) times daily.     Yes Historical Provider, MD  furosemide (LASIX) 80 MG tablet Take 80 mg by mouth daily.     Yes Historical Provider, MD  guaiFENesin (MUCINEX) 600 MG 12 hr tablet Take one and half tabley by mouth bid for 10 days.    Yes Historical Provider, MD  HYDROcodone-acetaminophen (LORCET) 10-650 MG per tablet Take 1 tablet by mouth as needed.     Yes Historical  Provider, MD  levothyroxine (SYNTHROID, LEVOTHROID) 100 MCG tablet Take 100 mcg by mouth daily.     Yes Historical Provider, MD  Multiple Vitamin (MULTIVITAMIN) tablet Take 1 tablet by mouth daily.     Yes Historical Provider, MD  potassium chloride (KLOR-CON) 20 MEQ packet Take 20 mEq by mouth 2 (two) times daily.     Yes Historical Provider, MD  pregabalin (LYRICA) 100 MG capsule Take 100 mg by mouth 2 (two) times daily.     Yes Historical Provider, MD  senna (SENOKOT) 8.6 MG tablet Take 2 tablets by mouth daily.     Yes Historical Provider, MD  simvastatin (ZOCOR) 20 MG tablet Take 20 mg by mouth at bedtime.     Yes Historical Provider, MD  traZODone (DESYREL) 50 MG tablet Take 50 mg by mouth at bedtime.     Yes Historical Provider, MD  vitamin B-12 (CYANOCOBALAMIN) 500 MCG tablet Take 500 mcg by mouth daily.     Yes Historical Provider, MD     Allergies:  Allergies  Allergen Reactions  . Levofloxacin     unknown     History   Social History  . Marital Status: Married    Spouse Name: N/A    Number of Children: N/A  . Years of Education: N/A   Occupational History  . Homemaker    Social History Main Topics  . Smoking status: Never Smoker   . Smokeless tobacco: Not on file  . Alcohol Use: No  . Drug Use: No  . Sexually Active: Not on file   Other Topics Concern  . Not on file   Social History Narrative   Married for 61 years1 daughter with breast cancer, 1 son overweightGets regular exercise, but limited due to peripheral neuropathyDiet: Fruit and veggies, H2O     Family History  Problem Relation Age of Onset  . Diabetes Mother   . Diabetes Maternal Grandmother   . Diabetes Maternal Grandfather   . Heart attack Neg Hx     No MI <65  . Breast cancer Daughter      Review of Systems:  General: negative for chills, fever, night sweats or weight changes.  Cardiovascular: negative for chest pain, edema, orthopnea, palpitations, paroxysmal nocturnal dyspnea,  shortness of breath or dyspnea on exertion Dermatological: negative for rash Respiratory: negative for cough or wheezing Urologic: negative for hematuria Abdominal: negative for nausea, vomiting, diarrhea, bright red blood per rectum, melena, or hematemesis Neurologic: negative for visual changes, syncope, or dizziness All other systems reviewed and are otherwise negative except as noted above.          Physical Exam:  Blood pressure 118/50, pulse 42, height 5' 8" (1.727 m), weight 163 lb (  73.936 kg). General: Well developed, well nourished, in no acute distress. Head: Normocephalic, atraumatic, sclera non-icteric, no xanthomas, nares are without discharge. Lymph Nodes:  none Neck: Negative for carotid bruits. JVD not elevated. Lungs: Clear bilaterally to auscultation without wheezes, rales, or rhonchi. Breathing is unlabored. Heart: Slow but regular rate and rhythm. No murmurs, rubs, or gallops appreciated. Abdomen: Soft, non-tender, non-distended with normoactive bowel sounds. No hepatomegaly. No rebound/guarding. No obvious abdominal masses. Msk:  Strength and tone appear normal for age. Back: Moderate scoliosis Extremities: No clubbing or cyanosis. Trace edema.  Distal pedal pulses are 2+ and equal bilaterally. Skin: Warm and Dry Neuro: Alert and oriented X 3. CN III-XII intact wide based gait as a consequence of her neuropathy  Psych:  Responds to questions appropriately with a normal affect.      Labs:  EKG: ECG today demonstrates sinus rhythm with 2 to one conduction she has right bundle branch block with left axis deviation and a possible inferior wall MI there is also suggestion of her prior anterior wall MI  ECG from 1217 demonstrates sinus rhythm with 2 to one conduction with interpolated PVCs   Assessment and Plan:    Katrina Jenkins  

## 2011-05-08 NOTE — Brief Op Note (Signed)
05/08/2011  5:52 PM  PATIENT:  Katrina Jenkins  78 y.o. female  PRE-OPERATIVE DIAGNOSIS:  av block  POST-OPERATIVE DIAGNOSIS:  * No post-op diagnosis entered *  PROCEDURE:  Procedure(s): PERMANENT PACEMAKER INSERTION  SURGEON:  Surgeon(s): Duke Salvia, MD  PHYSICIAN ASSISTANT:     ANESTHESIA:   local   DICTATION: .Other Dictation: Dictation Number [id number]

## 2011-05-09 ENCOUNTER — Other Ambulatory Visit: Payer: Self-pay

## 2011-05-09 ENCOUNTER — Ambulatory Visit (HOSPITAL_COMMUNITY): Payer: No Typology Code available for payment source

## 2011-05-09 NOTE — Op Note (Signed)
Katrina Jenkins, Katrina Jenkins NO.:  [id number]  MEDICAL RECORD NO.:  [id number]  LOCATION:  2501                         FACILITY:  [facility]  PHYSICIAN:  Duke Salvia, MD, FACCDATE OF BIRTH:  11/28/1922  DATE OF PROCEDURE:  05/08/2011 DATE OF DISCHARGE:                              OPERATIVE REPORT   PREOPERATIVE DIAGNOSIS:  Bradycardia, with 2:1 heart block.  POSTOPERATIVE DIAGNOSIS:  Bradycardia, with 2:1 heart block.  PROCEDURE:  Dual-chamber pacemaker implantation.  Following obtaining informed consent, the patient was brought to the electrophysiology laboratory and placed on the fluoroscopic table in supine position.  After routine prep and drape of the left upper chest, lidocaine was infiltrated in the prepectoral subclavicular region.  An incision was made and carried down to layer of the prepectoral fascia using electrocautery and sharp dissection.  A pocket was formed similarly.  Hemostasis was obtained.  Thereafter, attention was turned to gain access to the extrathoracic left subclavian vein, which was accomplished with modest difficulty. Because of that, I ended up with a single venipuncture and double-wired. Sequentially, then, 7-French sheaths were placed, through which were passed a St. Jude 1948 58 cm passive-fixation ventricular lead, serial number [id number] and a St. Jude active-fixation atrial lead, model 2088, serial number [id number].  Under fluoroscopic guidance, these were manipulated to the right ventricular apex and the right atrial appendage respectively, where the bipolar R-wave was 10 with a pacing impedance of 617, a threshold of less than 2 volts at 0.4 milliseconds.  There was no diaphragmatic pacing at 10 volts.  The bipolar P-wave was 2.1 with a pacing impedance of 488, a threshold of 0.9 at 0.4, current of threshold was about 2 mA.  There was no diaphragmatic pacing at 10 volts.  The current of injury was brisk. These leads were  secured to the prepectoral fascia and then a hemostatic suture was placed, and the leads were then attached to a St. Jude Accent DR pulse generator, model N3699945, serial number [id number].  Ventricular pacing and a P synchronous pacing were identified.  The pocket was copiously irrigated with antibiotic-containing saline solution. Hemostasis was assured.  Leads and the pulse generator were placed in the pocket secured to the prepectoral fascia and the wound was then closed in 3 layers in a normal fashion.  The wound was washed, dried, and a benzoin and Steri-Strip dressing was applied.  Needle counts, sponge counts, and instrument counts were correct at the end of procedure according to the staff.  The patient tolerated the procedure without apparent complication.     Duke Salvia, MD, [facility]    SCK/MEDQ  D:  05/08/2011  T:  05/09/2011  Job:  [id number]

## 2011-05-09 NOTE — Discharge Summary (Signed)
ELECTROPHYSIOLOGY PROCEDURE DISCHARGE SUMMARY    Patient ID: Katrina Jenkins,  MRN: [id number], DOB/AGE: 1923-01-20 78 y.o.  Admit date: 05/08/2011 Discharge date: 05/09/2011  Primary Care Physician: Katrina Jenny, MD Primary Cardiologist: Katrina Ginger, MD Electrophysiologist: Katrina Manges, MD  Primary Discharge Diagnosis:  Symptomatic bradycardia status post pacemaker implant this admission  Secondary Discharge Diagnosis:  1.  Hypertension 2.  Hyperlipidemia 3.  Hypothyroidism 4.  Asthma 5.  Peripheral neuropathy 6.  Non ischemic cardiomyopathy, EF 20-30% by echo 2/10, EF 50% January 2013 7.  Congenital absence of right ear 8.  Ventral hernia- awaiting surgical consultation  9.  PVC's  Procedures This Admission:  1.  Implantation of a dual chamber pacemaker by Dr Graciela Husbands on 05-08-2011.  The patient received a [employer] model number 2110 pacemaker with model number 2088 right atrial lead and model number 1948 right ventricular lead.  The patient had no early apparent complications. 2.  Chest x-ray on 05-09-2011 demonstrated no pneumothorax status post device implantation.   Brief HPI: Katrina Jenkins is a 78 year old female who was referred to Dr Graciela Husbands in the outpatient setting for treatment options for symptomatic bradycardia.  She has a history of PVC's that were felt to cause a cardiomyopathy that has since resolved.  Treatment options for bradycardia were reviewed with the patient including pacemaker implantation.  Risks, benefits, and alternatives to the procedure were discussed and she wished to proceed.  Hospital Course:  The patient was admitted on 05-08-2011 for planned implantation of a dual chamber pacemaker.  This was carried out by Dr Graciela Husbands with details as outlined above.  She was monitored on telemetry overnight which demonstrated sinus rhythm with ventricular pacing.  Her left chest was without hematoma or ecchymosis.  Her device was interrogated and found to be  functioning normally.  Chest xray was obtained which demonstrated no pneumothorax status post device implant.  Dr Graciela Husbands examined the patient and considered her stable for discharge to her assisted living facility.   Discharge Vitals: Blood pressure 134/70, pulse 65, temperature 98 F (36.7 C), temperature source Oral, resp. rate 15, height 5\' 8"  (1.727 m), weight 162 lb (73.483 kg), SpO2 94.00%.   Labs:   Lab Results  Component Value Date   WBC 7.9 05/04/2011   HGB 10.5* 05/04/2011   HCT 31.5* 05/04/2011   MCV 80 05/04/2011   PLT 222 05/04/2011    Lab 05/04/11 1403  NA 138  K 4.3  CL 104  CO2 19*  BUN 23  CREATININE 1.26*  CALCIUM 9.0  PROT --  BILITOT --  ALKPHOS --  ALT --  AST --  GLUCOSE 104*    Discharge Medications:  Medication List  As of 05/09/2011 10:12 AM   TAKE these medications         acetaminophen 325 MG tablet   Commonly known as: TYLENOL   Take 650 mg by mouth every 4 (four) hours as needed. For pain      BAYER CHILDRENS ASPIRIN 81 MG chewable tablet   Generic drug: aspirin   Chew 81 mg by mouth daily.      bisacodyl 5 MG EC tablet   Commonly known as: DULCOLAX   Take 10 mg by mouth at bedtime.      cholecalciferol 1000 UNITS tablet   Commonly known as: VITAMIN D   Take 2,000 Units by mouth daily.      fentaNYL 100 MCG/HR   Commonly known as: DURAGESIC - dosed  mcg/hr   Place 1 patch onto the skin every 3 (three) days.      flunisolide 29 MCG/ACT (0.025%) nasal spray   Commonly known as: NASAREL   Place 2 sprays into the nose as needed. Dose is for each nostril.      fluticasone 50 MCG/ACT nasal spray   Commonly known as: FLONASE   Place 2 sprays into the nose daily.      furosemide 80 MG tablet   Commonly known as: LASIX   Take 80 mg by mouth daily.      guaiFENesin 600 MG 12 hr tablet   Commonly known as: MUCINEX   Take one and half tabley by mouth bid for 10 days.      HYDROcodone-acetaminophen 5-500 MG per tablet   Commonly known as:  VICODIN   Take 1 tablet by mouth every 4 (four) hours as needed. For pain      levothyroxine 100 MCG tablet   Commonly known as: SYNTHROID, LEVOTHROID   Take 100 mcg by mouth daily.      oxycodone 5 MG capsule   Commonly known as: OXY-IR   Take 5 mg by mouth every 4 (four) hours as needed. For pain      potassium chloride SA 20 MEQ tablet   Commonly known as: K-DUR,KLOR-CON   Take 20 mEq by mouth daily.      pregabalin 100 MG capsule   Commonly known as: LYRICA   Take 100 mg by mouth 3 (three) times daily.      PROAIR HFA 108 (90 BASE) MCG/ACT inhaler   Generic drug: albuterol   Inhale 2 puffs into the lungs every 4 (four) hours as needed. For shortness of breath      albuterol (2.5 MG/3ML) 0.083% nebulizer solution   Commonly known as: PROVENTIL   Take 2.5 mg by nebulization every 6 (six) hours as needed. For shortness of breath      senna 8.6 MG tablet   Commonly known as: SENOKOT   Take 1 tablet by mouth 2 (two) times daily.      simvastatin 20 MG tablet   Commonly known as: ZOCOR   Take 20 mg by mouth at bedtime.      sodium chloride 0.65 % nasal spray   Commonly known as: OCEAN   Place 1 spray into the nose as needed.      traZODone 50 MG tablet   Commonly known as: DESYREL   Take 50 mg by mouth at bedtime.      vitamin B-12 500 MCG tablet   Commonly known as: CYANOCOBALAMIN   Take 500 mcg by mouth daily.            Disposition:  Discharge Orders    Future Appointments: Provider: Department: Dept Phone: Center:   05/19/2011 10:00 AM Duke Salvia, MD [facility] [phone] [facility]   06/13/2011 2:00 PM Elyn Aquas., MD [facility] [phone] [facility]     Future Orders Please Complete By Expires   Diet - low sodium heart healthy      Increase activity slowly      Comments:   Please see attached sheet for instructions on wound care, activity, and bathing.       Follow-up Information    Follow up with [facility]  [place]. (05/19/11 at 10am)    Contact information:   [address] [place] [address]          Duration of Discharge Encounter:  Greater than 30 minutes including physician time.  Signed, Gypsy Balsam, RN, BSN 05/09/2011, 10:12 AM

## 2011-05-09 NOTE — Progress Notes (Signed)
  Patient Name: Katrina Jenkins      SUBJECTIVE:mininmal discomfort  No shortness of breath   Past Medical History  Diagnosis Date  . CHF (congestive heart failure)     Due to nonischemic CM (?PVC related); EF 20-30% by ECHO 2/10. Mild MR (echo 9/09 35-40%); Minimal non-obstructive CAD by cath 6/09  . Hypertension   . Hyperlipidemia   . Hypothyroidism   . Asthma   . Allergic rhinitis   . Osteoarthritis   . Congenital absence of ear     Right  . Peripheral neuropathy     Unknown etiology. Wears a Fentanyl patch.  . History of alcohol abuse     Quit long time ago  . Foot ulcer     Right  . Hernia     ventral; unrepaired  . Hernia   . Bradycardia   . PVC's (premature ventricular contractions)     Frequent; Previosly on Amiodarone to suppress. Limited by tremors; Discussed ablation and CRT with Dr. Graciela Husbands but decided to defer  . AV block, 1st degree   . COPD (chronic obstructive pulmonary disease)   . Pneumonia   . Chronic bronchitis   . Shortness of breath 05/08/11    "just about all the time lately"  . Chronic lower back pain     PHYSICAL EXAM Filed Vitals:   05/08/11 2025 05/08/11 2030 05/09/11 0006 05/09/11 0524  BP: 106/61 106/61 137/64 134/70  Pulse: 71  71 65  Temp: 98.6 F (37 C)  98.2 F (36.8 C) 98 F (36.7 C)  TempSrc: Oral  Oral Oral  Resp: 14 12 17 15   Height:      Weight:      SpO2: 95%  93% 94%    General appearance: without distress Lungs clear Wound min blood on bandage withoug hematoma RRR without murmur Soft and active BS No edema TELEMETRY: Reviewed telemetry pt in  psynchronous pacing:    Intake/Output Summary (Last 24 hours) at 05/09/11 0834 Last data filed at 05/09/11 0626  Gross per 24 hour  Intake    730 ml  Output    575 ml  Net    155 ml    LABS: Basic Metabolic Panel:  Lab 05/04/11 4098  NA 138  K 4.3  CL 104  CO2 19*  GLUCOSE 104*  BUN 23  CREATININE 1.26*  CALCIUM 9.0  MG --  PHOS --   Cardiac  Enzymes: No results found for this basename: CKTOTAL:3,CKMB:3,CKMBINDEX:3,TROPONINI:3 in the last 72 hours CBC:  Lab 05/04/11 1403  WBC 7.9  NEUTROABS 5.0  HGB 10.5*  HCT 31.5*  MCV 80  PLT 222      Device Interrogation: device function normal   ASSESSMENT AND PLAN: Patient Active Hospital Problem List: cardiomyopathy-- prev 25% now 50% EF (03/15/2009) continue currrent meds   PREMATURE VENTRICULAR CONTRACTIONS, FREQUENT (06/19/2008); will follow via device  2:1 AV heart block (02/17/2009)   Chronic diastolic heart failure (06/19/2008)    S/p pacer, functioning normally  Will discharge to home  Wound check [facility] in10-14days *     Signed, Sherryl Manges MD  05/09/2011

## 2011-05-09 NOTE — Progress Notes (Signed)
Physical Therapy Evaluation Patient Details Name: Katrina Jenkins MRN: [id number] DOB: 11-10-1922 Today's Date: 05/09/2011  Problem List:  Patient Active Problem List  Diagnoses  . HYPOTHYROIDISM NOS  . HYPERLIPIDEMIA  . DEMENTIA, MILD  . CONFUSION  . NEUROPATHY, IDIOPATHIC PERIPHERAL NOS  . HYPERTENSION  . cardiomyopathy-- prev 25% now 50% EF  . PREMATURE VENTRICULAR CONTRACTIONS, FREQUENT  . 2:1 AV heart block  . Chronic diastolic heart failure  . DERMATITIS, STASIS  . INSUFFICIENCY, VENOUS NOS  . ALLERGIC RHINITIS  . ASTHMA, INTRINSIC NOS  . CELLULITIS, FOOT, LEFT  . OSTEOARTHRITIS  . ALTERED MENTAL STATUS  . SHORTNESS OF BREATH    Past Medical History:  Past Medical History  Diagnosis Date  . CHF (congestive heart failure)     Due to nonischemic CM (?PVC related); EF 20-30% by ECHO 2/10. Mild MR (echo 9/09 35-40%); Minimal non-obstructive CAD by cath 6/09  . Hypertension   . Hyperlipidemia   . Hypothyroidism   . Asthma   . Allergic rhinitis   . Osteoarthritis   . Congenital absence of ear     Right  . Peripheral neuropathy     Unknown etiology. Wears a Fentanyl patch.  . History of alcohol abuse     Quit long time ago  . Foot ulcer     Right  . Hernia     ventral; unrepaired  . Hernia   . Bradycardia   . PVC's (premature ventricular contractions)     Frequent; Previosly on Amiodarone to suppress. Limited by tremors; Discussed ablation and CRT with Dr. Graciela Husbands but decided to defer  . AV block, 1st degree   . COPD (chronic obstructive pulmonary disease)   . Pneumonia   . Chronic bronchitis   . Shortness of breath 05/08/11    "just about all the time lately"  . Chronic lower back pain    Past Surgical History:  Past Surgical History  Procedure Date  . Doppler echocardiography 04/2006    25-30%  . Insert / replace / remove pacemaker 05/08/11    initial placement  . Tonsillectomy and adenoidectomy 1928  . Abdominal hysterectomy ~1975  . Dilation and  curettage of uterus   . Cataract extraction w/ intraocular lens  implant, bilateral ~ 2007  . Joint replacement   . Replacement total knee ~ 2000    right  . Neck surgery     "don't remember what it was for; it was in the back of my neck"  . Thyroid surgery     "nodule removed years ago"    PT Assessment/Plan/Recommendation PT Assessment Clinical Impression Statement: pt presents s/p pacemaker implant.  pt lives at ALF with husband and will have A from ALF staff at D/C.   PT Recommendation/Assessment: Patient will need skilled PT in the acute care venue PT Problem List: Decreased strength;Decreased activity tolerance;Decreased balance;Decreased mobility;Decreased knowledge of use of DME;Decreased knowledge of precautions Barriers to Discharge: None PT Therapy Diagnosis : Difficulty walking PT Plan PT Frequency: Min 3X/week PT Treatment/Interventions: DME instruction;Gait training;Functional mobility training;Therapeutic activities;Therapeutic exercise;Balance training;Neuromuscular re-education;Patient/family education PT Recommendation Follow Up Recommendations: Home health PT Equipment Recommended: None recommended by PT PT Goals  Acute Rehab PT Goals PT Goal Formulation: With patient Time For Goal Achievement: 7 days Pt will go Supine/Side to Sit: Independently PT Goal: Supine/Side to Sit - Progress: Goal set today Pt will go Sit to Supine/Side: Independently PT Goal: Sit to Supine/Side - Progress: Goal set today Pt will go Sit to  Stand: with modified independence PT Goal: Sit to Stand - Progress: Goal set today Pt will Ambulate: >150 feet;with modified independence;with rolling walker PT Goal: Ambulate - Progress: Goal set today  PT Evaluation Precautions/Restrictions  Precautions Precautions: ICD/Pacemaker Precaution Comments: Sling to minimize use and for comfort Required Braces or Orthoses: No Restrictions Weight Bearing Restrictions: No Prior Functioning  Home  Living Lives With: Spouse Receives Help From: Family;Personal care attendant Type of Home: Assisted living Home Layout: One level Home Access: Level entry Bathroom Shower/Tub: Health visitor: Handicapped height Bathroom Accessibility: Yes How Accessible: Accessible via wheelchair Home Adaptive Equipment: Walker - four wheeled Additional Comments: pt notes ALF staff able to provide increased level of A at D/C.   Prior Function Level of Independence: Independent with basic ADLs;Independent with gait;Independent with transfers;Requires assistive device for independence;Needs assistance with homemaking Able to Take Stairs?: No Driving: No Cognition Cognition Orientation Level: Oriented X4 Sensation/Coordination   Extremity Assessment RLE Assessment RLE Assessment: Within Functional Limits LLE Assessment LLE Assessment: Within Functional Limits Mobility (including Balance) Bed Mobility Bed Mobility: Yes Supine to Sit: 4: Min assist Supine to Sit Details (indicate cue type and reason): cues to minimize use of L UE Sitting - Scoot to Edge of Bed: 4: Min assist Sitting - Scoot to [place] of Bed Details (indicate cue type and reason): cues for reciprocal scoot Sit to Supine: 4: Min assist Transfers Transfers: Yes Sit to Stand: 4: Min assist;With upper extremity assist;From bed Sit to Stand Details (indicate cue type and reason): cues for use of UEs Stand to Sit: 5: Supervision;With upper extremity assist;To bed Stand to Sit Details: demos good technique Ambulation/Gait Ambulation/Gait: Yes Ambulation/Gait Assistance: 5: Supervision Ambulation/Gait Assistance Details (indicate cue type and reason): cues for encouragement, minimize use of L UE on RW Ambulation Distance (Feet): 100 Feet Assistive device: 4-wheeled walker Gait Pattern: Decreased stride length;Lateral trunk lean to right Stairs: No Wheelchair Mobility Wheelchair Mobility: No  Posture/Postural  Control Posture/Postural Control: Postural limitations Postural Limitations: pt with scoliosis.   Exercise    End of Session PT - End of Session Equipment Utilized During Treatment: Gait belt Activity Tolerance: Patient tolerated treatment well Patient left: in bed;with call bell in reach Nurse Communication: Mobility status for transfers;Mobility status for ambulation General Behavior During Session: [facility] for tasks performed Cognition: [facility] for tasks performed  Sunny Schlein, [place] [phone] 05/09/2011, 1:11 PM

## 2011-05-19 ENCOUNTER — Encounter: Payer: No Typology Code available for payment source | Admitting: Internal Medicine

## 2011-05-19 ENCOUNTER — Encounter (INDEPENDENT_AMBULATORY_CARE_PROVIDER_SITE_OTHER): Payer: No Typology Code available for payment source | Admitting: Internal Medicine

## 2011-05-19 ENCOUNTER — Encounter: Payer: Self-pay | Admitting: Internal Medicine

## 2011-05-19 DIAGNOSIS — I4430 Unspecified atrioventricular block: Secondary | ICD-10-CM

## 2011-05-19 DIAGNOSIS — I428 Other cardiomyopathies: Secondary | ICD-10-CM

## 2011-05-19 LAB — PACEMAKER DEVICE OBSERVATION
AL AMPLITUDE: 1.8 mv
AL THRESHOLD: 0.5 V
BAMS-0001: 150 {beats}/min
BATTERY VOLTAGE: 3.0832 V
DEVICE MODEL PM: 2737768
RV LEAD AMPLITUDE: 7.3 mv
RV LEAD THRESHOLD: 0.5 V

## 2011-05-22 ENCOUNTER — Telehealth: Payer: Self-pay | Admitting: Internal Medicine

## 2011-05-22 NOTE — Telephone Encounter (Signed)
OT order given per request.

## 2011-05-22 NOTE — Telephone Encounter (Signed)
Pt need OT for 2 times week for 2 weeks and then 1 time a week for 1 week. Per Amy from [employer]. [phone]. Verbal order is fine with Nurse. Please call

## 2011-06-13 ENCOUNTER — Encounter: Payer: Self-pay | Admitting: Cardiovascular Disease

## 2011-06-13 ENCOUNTER — Ambulatory Visit (INDEPENDENT_AMBULATORY_CARE_PROVIDER_SITE_OTHER): Payer: No Typology Code available for payment source | Admitting: Cardiovascular Disease

## 2011-06-13 DIAGNOSIS — I5022 Chronic systolic (congestive) heart failure: Secondary | ICD-10-CM

## 2011-06-13 DIAGNOSIS — I4949 Other premature depolarization: Secondary | ICD-10-CM

## 2011-06-13 DIAGNOSIS — I509 Heart failure, unspecified: Secondary | ICD-10-CM

## 2011-06-13 DIAGNOSIS — I4430 Unspecified atrioventricular block: Secondary | ICD-10-CM

## 2011-06-13 NOTE — Patient Instructions (Signed)
Your physician wants you to follow-up in: 6 months You will receive a reminder letter in the mail two months in advance. If you don't receive a letter, please call our office to schedule the follow-up appointment.  Your physician recommends that you return for lab work in: BMET on same day as appointment.

## 2011-06-13 NOTE — Progress Notes (Signed)
Katrina Jenkins Date of Birth  1922-12-29 [facility] [address]    [address] [place], [place]  [address] [phone]  Fax  [phone]  History of Present Illness:  Katrina Jenkins is a 78 yo with hx of CHF, ( EF originally was 20% - 30%, now her ejection fraction is 45-50%).  She has had recent worsening DOE.  She has had episodes of bradycardia over the years   She notes slight worsening of her dyspnea over the past couple of months.  She also notes fatigue. Routine blood work has not found any specific abnormalities. No chest pain.  She has orthopnea and PND.  She sleeps in a hospital bed with the head raised.  She watches her salt  And uses KCl for salt substitute.  She has had a recent viral URI for hoarse voice.  She has seen ENT.  She walks with a walker.  She had a long hospitalization last year.  She was in the hospital for 4 months for small bowel obstruction and subsequent complications.  She has been very weak and has required lots of rehab since that hospitalization.  She and her husband now live at [facility].    She was recently admitted with significant bradycardia and had a pacemaker placed.  An echocardiogram recently has revealed that her ejection fraction has increased now up to around 45 to 50%.  Current Outpatient Prescriptions on File Prior to Visit  Medication Sig Dispense Refill  . acetaminophen (TYLENOL) 325 MG tablet Take 650 mg by mouth every 4 (four) hours as needed. For pain      . albuterol (PROVENTIL) (2.5 MG/3ML) 0.083% nebulizer solution Take 2.5 mg by nebulization every 6 (six) hours as needed. For shortness of breath      . aspirin (BAYER CHILDRENS ASPIRIN) 81 MG chewable tablet Chew 81 mg by mouth daily.        . bisacodyl (DULCOLAX) 5 MG EC tablet Take 10 mg by mouth at bedtime.       . cholecalciferol (VITAMIN D) 1000 UNITS tablet Take 2,000 Units by mouth daily.      . flunisolide (NASAREL) 29 MCG/ACT (0.025%) nasal spray Place 2  sprays into the nose as needed. Dose is for each nostril.       . fluticasone (FLONASE) 50 MCG/ACT nasal spray Place 2 sprays into the nose daily.      . furosemide (LASIX) 80 MG tablet Take 80 mg by mouth daily.        Marland Kitchen HYDROcodone-acetaminophen (VICODIN) 5-500 MG per tablet Take 1 tablet by mouth every 4 (four) hours as needed. For pain      . levothyroxine (SYNTHROID, LEVOTHROID) 100 MCG tablet Take 100 mcg by mouth daily.        Marland Kitchen oxycodone (OXY-IR) 5 MG capsule Take 5 mg by mouth every 4 (four) hours as needed. For pain      . potassium chloride SA (K-DUR,KLOR-CON) 20 MEQ tablet Take 20 mEq by mouth daily.      . pregabalin (LYRICA) 100 MG capsule Take 100 mg by mouth 3 (three) times daily.       Marland Kitchen senna (SENOKOT) 8.6 MG tablet Take 1 tablet by mouth 2 (two) times daily.       . simvastatin (ZOCOR) 20 MG tablet Take 20 mg by mouth at bedtime.        . sodium chloride (OCEAN) 0.65 % nasal spray Place 1 spray into the nose as  needed.      . traZODone (DESYREL) 50 MG tablet Take 50 mg by mouth at bedtime.        . vitamin B-12 (CYANOCOBALAMIN) 500 MCG tablet Take 500 mcg by mouth daily.          Allergies  Allergen Reactions  . Levofloxacin     unknown     Past Medical History  Diagnosis Date  . CHF (congestive heart failure)     Due to nonischemic CM (?PVC related); EF 20-30% by ECHO 2/10. Mild MR (echo 9/09 35-40%); Minimal non-obstructive CAD by cath 6/09  . Hypertension   . Hyperlipidemia   . Hypothyroidism   . Asthma   . Allergic rhinitis   . Osteoarthritis   . Congenital absence of ear     Right  . Peripheral neuropathy     Unknown etiology. Wears a Fentanyl patch.  . History of alcohol abuse     Quit long time ago  . Foot ulcer     Right  . Hernia     ventral; unrepaired  . Hernia   . Bradycardia   . PVC's (premature ventricular contractions)     Frequent; Previosly on Amiodarone to suppress. Limited by tremors; Discussed ablation and CRT with Dr. Graciela Husbands but  decided to defer  . AV block, 1st degree   . COPD (chronic obstructive pulmonary disease)   . Pneumonia   . Chronic bronchitis   . Shortness of breath 05/08/11    "just about all the time lately"  . Chronic lower back pain     Past Surgical History  Procedure Date  . Doppler echocardiography 04/2006    25-30%  . Tonsillectomy and adenoidectomy 1928  . Abdominal hysterectomy ~1975  . Dilation and curettage of uterus   . Cataract extraction w/ intraocular lens  implant, bilateral ~ 2007  . Joint replacement   . Replacement total knee ~ 2000    right  . Neck surgery     "don't remember what it was for; it was in the back of my neck"  . Thyroid surgery     "nodule removed years ago"  . Insert / replace / remove pacemaker 05/08/11    initial placement    History  Smoking status  . Never Smoker   Smokeless tobacco  . Never Used    History  Alcohol Use  . Yes    "last alcohol ~ 2002"    Family History  Problem Relation Age of Onset  . Diabetes Mother   . Diabetes Maternal Grandmother   . Diabetes Maternal Grandfather   . Heart attack Neg Hx     No MI <65  . Breast cancer Daughter     Reviw of Systems:  Reviewed in the HPI.  All other systems are negative.  Physical Exam: BP 118/58  Pulse 64  Ht 5\' 8"  (1.727 m)  Wt 162 lb 12 oz (73.823 kg)  BMI 24.75 kg/m2 The patient is alert and oriented x 3.  The mood and affect are normal.   Skin: warm and dry.  Color is normal.    HEENT:   Normocephalic/atraumatic. She is normal carotids. There is no JVD.  Lungs: Lungs are clear to auscultation.   Heart: Regular rate.  Soft murmur.  There is a small suture knot coming from the medial aspect of her pacer incision.  This suture was based with Betadine, pulled up, and cut off below the skin level  Abdomen: Is good bowel  sounds. There is no hepatosplenomegaly.  Extremities:  No clubbing cyanosis or edema.  Neuro:  There exam is nonfocal.    ECG: Her EKG reveals  normal sinus rhythm. She has a right bundle branch block. She has intermittent pacing.  Her second EKG with a magnet reveals AV pacing.  Assessment / Plan:

## 2011-06-13 NOTE — Assessment & Plan Note (Signed)
Mrs. Katrina Jenkins has chronic systolic congestive heart failure. Her heart it has improved dramatically and her ejection fraction is now 45-50%. We'll continue with medical therapy.

## 2011-06-13 NOTE — Assessment & Plan Note (Signed)
She is now status post pacemaker implantation. She had a small suture coming from the wound of her pacemaker. The suture was snipped off below the level of the skin.  She will continue to followup in device clinic.

## 2011-09-05 ENCOUNTER — Encounter: Payer: No Typology Code available for payment source | Admitting: Internal Medicine

## 2011-09-20 ENCOUNTER — Encounter: Payer: Self-pay | Admitting: *Deleted

## 2011-09-20 DIAGNOSIS — Z950 Presence of cardiac pacemaker: Secondary | ICD-10-CM | POA: Insufficient documentation

## 2011-09-26 ENCOUNTER — Encounter: Payer: Self-pay | Admitting: Internal Medicine

## 2011-09-26 ENCOUNTER — Ambulatory Visit (INDEPENDENT_AMBULATORY_CARE_PROVIDER_SITE_OTHER): Payer: No Typology Code available for payment source | Admitting: Internal Medicine

## 2011-09-26 VITALS — BP 101/54 | HR 35 | Ht 68.0 in | Wt 155.0 lb

## 2011-09-26 DIAGNOSIS — I428 Other cardiomyopathies: Secondary | ICD-10-CM

## 2011-09-26 DIAGNOSIS — I4949 Other premature depolarization: Secondary | ICD-10-CM

## 2011-09-26 DIAGNOSIS — I4430 Unspecified atrioventricular block: Secondary | ICD-10-CM

## 2011-09-26 DIAGNOSIS — I493 Ventricular premature depolarization: Secondary | ICD-10-CM | POA: Insufficient documentation

## 2011-09-26 DIAGNOSIS — I441 Atrioventricular block, second degree: Secondary | ICD-10-CM

## 2011-09-26 DIAGNOSIS — Z950 Presence of cardiac pacemaker: Secondary | ICD-10-CM

## 2011-09-26 LAB — PACEMAKER DEVICE OBSERVATION
AL AMPLITUDE: 1.4 mv
AL IMPEDENCE PM: 375 Ohm
BAMS-0001: 150 {beats}/min
BATTERY VOLTAGE: 2.9629 V
VENTRICULAR PACING PM: 88

## 2011-09-26 NOTE — Assessment & Plan Note (Signed)
Improved post pacing 

## 2011-09-26 NOTE — Patient Instructions (Signed)
Your physician wants you to follow-up in: January with Dr. Graciela Husbands and device check. You will receive a reminder letter in the mail two months in advance. If you don't receive a letter, please call our office to schedule the follow-up appointment.

## 2011-09-26 NOTE — Progress Notes (Signed)
HPI  Katrina Jenkins is a 78 y.o. female Seen in followup for pacemaker implanted January 2013 for symptomatic 21 heart block with bradycardia as well as PVCs which we hoped to overdrive suppress  She is feeling much better since the pacemaker was put in. She is much improved energy and much less shortness of breath. She is very GLAD unchanged since her pacemaker was implanted Past Medical History  Diagnosis Date  . Chronic combined systolic and diastolic heart failure     Due to nonischemic CM (?PVC related); EF 20-30% by ECHO 2/10. Mild MR (echo 9/09 35-40%); Minimal non-obstructive CAD by cath 6/09  . Hypertension   . Hyperlipidemia   . Hypothyroidism   . Asthma   . Allergic rhinitis   . Osteoarthritis   . Congenital absence of ear     Right  . Peripheral neuropathy     Unknown etiology. Wears a Fentanyl patch.  . Pacemaker     Quit long time ago  . Foot ulcer     Right  . Hernia     ventral; unrepaired  . PVC's (premature ventricular contractions)     Frequent; Previosly on Amiodarone to suppress. Limited by tremors; Discussed ablation and CRT with Dr. Graciela Husbands but decided to defer  . second degree atrioventricular block   . COPD (chronic obstructive pulmonary disease)   . Pneumonia   . Chronic bronchitis   . Shortness of breath 05/08/11    "just about all the time lately"  . Chronic lower back pain     Past Surgical History  Procedure Date  . Doppler echocardiography 04/2006    25-30%  . Tonsillectomy and adenoidectomy 1928  . Abdominal hysterectomy ~1975  . Dilation and curettage of uterus   . Cataract extraction w/ intraocular lens  implant, bilateral ~ 2007  . Joint replacement   . Replacement total knee ~ 2000    right  . Neck surgery     "don't remember what it was for; it was in the back of my neck"  . Thyroid surgery     "nodule removed years ago"  . Insert / replace / remove pacemaker 05/08/11    initial placement    Current Outpatient Prescriptions   Medication Sig Dispense Refill  . acetaminophen (TYLENOL) 325 MG tablet Takes 1-2 tablets For pain as needed      . albuterol (PROVENTIL) (2.5 MG/3ML) 0.083% nebulizer solution Take 2.5 mg by nebulization every 6 (six) hours as needed. For shortness of breath       . aspirin (BAYER CHILDRENS ASPIRIN) 81 MG chewable tablet Chew 81 mg by mouth daily.        . cholecalciferol (VITAMIN D) 1000 UNITS tablet Take 2,000 Units by mouth daily.      . flunisolide (NASAREL) 29 MCG/ACT (0.025%) nasal spray Place 2 sprays into the nose as needed. Dose is for each nostril.       . fluticasone (FLONASE) 50 MCG/ACT nasal spray Place 2 sprays into the nose daily.      . furosemide (LASIX) 40 MG tablet Take 40 mg by mouth daily.      Marland Kitchen guaifenesin (ROBITUSSIN) 100 MG/5ML syrup Take 100 mg by mouth as needed.      Marland Kitchen HYDROcodone-acetaminophen (VICODIN) 5-500 MG per tablet Take 1 tablet by mouth every 4 (four) hours as needed. For pain       . levothyroxine (SYNTHROID, LEVOTHROID) 88 MCG tablet Take 88 mcg by mouth daily.      Marland Kitchen  methadone (DOLOPHINE) 10 MG tablet Take 10 mg by mouth every 8 (eight) hours.      . methadone (DOLOPHINE) 5 MG tablet Take 5 mg by mouth 3 (three) times daily.      Marland Kitchen oxycodone (OXY-IR) 5 MG capsule Take 5 mg by mouth every 4 (four) hours as needed. For pain       . potassium chloride SA (K-DUR,KLOR-CON) 20 MEQ tablet Take 20 mEq by mouth daily.      . pregabalin (LYRICA) 100 MG capsule Take 100 mg by mouth 3 (three) times daily.       . promethazine (PHENERGAN) 12.5 MG tablet Take 12.5 mg by mouth every 6 (six) hours as needed.      . ranitidine (ZANTAC) 150 MG tablet Take 150 mg by mouth daily.      Marland Kitchen senna (SENOKOT) 8.6 MG tablet Take 1 tablet by mouth 2 (two) times daily.       . simvastatin (ZOCOR) 20 MG tablet Take 20 mg by mouth at bedtime.        . sodium chloride (OCEAN) 0.65 % nasal spray Place 1 spray into the nose as needed.      . traZODone (DESYREL) 50 MG tablet Take 50 mg  by mouth at bedtime.        . vitamin B-12 (CYANOCOBALAMIN) 500 MCG tablet Take 500 mcg by mouth daily.          Allergies  Allergen Reactions  . Levofloxacin     unknown     Review of Systems negative except from HPI and PMH  Physical Exam BP 101/54  Pulse 35  Ht 5\' 8"  (1.727 m)  Wt 155 lb (70.308 kg)  BMI 23.57 kg/m2 Well developed and well nourished in no acute distress HENT normal E scleral and icterus clear Neck Supple JVP flat; carotids brisk and full Clear to ausculation Pacemaker pocket well healed Regular rate and rhythm, no murmurs gallops or rub Soft with active bowel sounds No clubbing cyanosis none Edema Alert and oriented, grossly normal motor and sensory function Skin Warm and Dry    Assessment and  Plan

## 2011-09-26 NOTE — Assessment & Plan Note (Signed)
The patient's device was interrogated and the information was fully reviewed.  The device was reprogrammed to maximize longevity  

## 2011-09-26 NOTE — Assessment & Plan Note (Signed)
Continue curren meds

## 2011-12-13 ENCOUNTER — Ambulatory Visit (INDEPENDENT_AMBULATORY_CARE_PROVIDER_SITE_OTHER): Payer: No Typology Code available for payment source | Admitting: Cardiovascular Disease

## 2011-12-13 ENCOUNTER — Encounter: Payer: Self-pay | Admitting: Cardiovascular Disease

## 2011-12-13 VITALS — BP 120/75 | HR 87 | Ht 67.0 in | Wt 154.2 lb

## 2011-12-13 DIAGNOSIS — I4430 Unspecified atrioventricular block: Secondary | ICD-10-CM

## 2011-12-13 DIAGNOSIS — I428 Other cardiomyopathies: Secondary | ICD-10-CM

## 2011-12-13 DIAGNOSIS — I4949 Other premature depolarization: Secondary | ICD-10-CM

## 2011-12-13 DIAGNOSIS — I493 Ventricular premature depolarization: Secondary | ICD-10-CM

## 2011-12-13 NOTE — Patient Instructions (Signed)
Your physician wants you to follow-up in: 6 months with Dr. Nahser. You will receive a reminder letter in the mail two months in advance. If you don't receive a letter, please call our office to schedule the follow-up appointment.  

## 2011-12-13 NOTE — Progress Notes (Signed)
Katrina Jenkins Date of Birth  1922/08/29 [facility] [address]    [address] [place], [place]  [address] [phone]  Fax  [phone]   Problems: 1. Congestive heart failure-original ejection fraction was 10-30%. Her now her ejection fraction has improved to 50% 2. History of heart block-status post pacemaker 3. Hypothyroidism 4. Hyperlipidemia   History of Present Illness:  Katrina Jenkins is a 78 yo with hx of CHF, ( EF originally was 20% - 30%, now her ejection fraction is 45-50%).  She has had recent worsening DOE.  She has had episodes of bradycardia over the years   She notes slight worsening of her dyspnea over the past couple of months.  She also notes fatigue. Routine blood work has not found any specific abnormalities. No chest pain.  She has orthopnea and PND.  She sleeps in a hospital bed with the head raised.  She watches her salt  And uses KCl for salt substitute.  She has had a recent viral URI for hoarse voice.  She has seen ENT.  She walks with a walker.  She had a long hospitalization last year.  She was in the hospital for 4 months for small bowel obstruction and subsequent complications.  She has been very weak and has required lots of rehab since that hospitalization.  She and her husband now live at the [facility].    She was recently admitted with significant bradycardia and had a pacemaker placed.  An echocardiogram recently has revealed that her ejection fraction has increased now up to around 45 to 50%.  December 13, 2011 She has not had any new problems.  She complains of fatigue and boredom.  She lives at "[facility] [address]" and doesn't find much to do during the day.  Her husband has been ill  Current Outpatient Prescriptions on File Prior to Visit  Medication Sig Dispense Refill  . acetaminophen (TYLENOL) 325 MG tablet Takes 1-2 tablets For pain as needed      . albuterol (PROVENTIL) (2.5 MG/3ML) 0.083% nebulizer solution Take 2.5 mg by nebulization  every 6 (six) hours as needed. For shortness of breath       . cholecalciferol (VITAMIN D) 1000 UNITS tablet Take 2,000 Units by mouth daily.      . fluticasone (FLONASE) 50 MCG/ACT nasal spray Place 2 sprays into the nose daily.      . furosemide (LASIX) 40 MG tablet Take 40 mg by mouth daily.      Marland Kitchen guaifenesin (ROBITUSSIN) 100 MG/5ML syrup Take 100 mg by mouth as needed.      Marland Kitchen levothyroxine (SYNTHROID, LEVOTHROID) 88 MCG tablet Take 88 mcg by mouth daily.      . methadone (DOLOPHINE) 10 MG tablet Take 10 mg by mouth every 8 (eight) hours.      . methadone (DOLOPHINE) 5 MG tablet Take 2.5 mg by mouth 3 (three) times daily.       Marland Kitchen oxycodone (OXY-IR) 5 MG capsule Take 5 mg by mouth every 4 (four) hours as needed. For pain       . potassium chloride SA (K-DUR,KLOR-CON) 20 MEQ tablet Take 20 mEq by mouth daily.      . pregabalin (LYRICA) 100 MG capsule Take 100 mg by mouth 3 (three) times daily.       . ranitidine (ZANTAC) 150 MG tablet Take 150 mg by mouth daily.      . simvastatin (ZOCOR) 20 MG tablet Take 20 mg by mouth  at bedtime.        . sodium chloride (OCEAN) 0.65 % nasal spray Place 1 spray into the nose as needed.      . traZODone (DESYREL) 50 MG tablet Take 50 mg by mouth at bedtime.        . vitamin B-12 (CYANOCOBALAMIN) 500 MCG tablet Take 500 mcg by mouth daily.          Allergies  Allergen Reactions  . Levofloxacin     unknown     Past Medical History  Diagnosis Date  . Chronic combined systolic and diastolic heart failure     Due to nonischemic CM (?PVC related); EF 20-30% by ECHO 2/10. Mild MR (echo 9/09 35-40%); Minimal non-obstructive CAD by cath 6/09  . Hypertension   . Hyperlipidemia   . Hypothyroidism   . Asthma   . Allergic rhinitis   . Osteoarthritis   . Congenital absence of ear     Right  . Peripheral neuropathy     Unknown etiology. Wears a Fentanyl patch.  . Pacemaker     Quit long time ago  . Foot ulcer     Right  . Hernia     ventral;  unrepaired  . PVC's (premature ventricular contractions)     Frequent; Previosly on Amiodarone to suppress. Limited by tremors; Discussed ablation and CRT with Dr. Graciela Husbands but decided to defer  . second degree atrioventricular block   . COPD (chronic obstructive pulmonary disease)   . Pneumonia   . Chronic bronchitis   . Shortness of breath 05/08/11    "just about all the time lately"  . Chronic lower back pain     Past Surgical History  Procedure Date  . Doppler echocardiography 04/2006    25-30%  . Tonsillectomy and adenoidectomy 1928  . Abdominal hysterectomy ~1975  . Dilation and curettage of uterus   . Cataract extraction w/ intraocular lens  implant, bilateral ~ 2007  . Joint replacement   . Replacement total knee ~ 2000    right  . Neck surgery     "don't remember what it was for; it was in the back of my neck"  . Thyroid surgery     "nodule removed years ago"  . Insert / replace / remove pacemaker 05/08/11    initial placement    History  Smoking status  . Never Smoker   Smokeless tobacco  . Never Used    History  Alcohol Use  . Yes    "last alcohol ~ 2002"    Family History  Problem Relation Age of Onset  . Diabetes Mother   . Diabetes Maternal Grandmother   . Diabetes Maternal Grandfather   . Heart attack Neg Hx     No MI <65  . Breast cancer Daughter     Reviw of Systems:  Reviewed in the HPI.  All other systems are negative.  Physical Exam: BP 120/75  Pulse 87  Ht 5\' 7"  (1.702 m)  Wt 154 lb 4 oz (69.967 kg)  BMI 24.16 kg/m2 The patient is alert and oriented x 3.  The mood and affect are normal.   Skin: warm and dry.  Color is normal.    HEENT:   Normocephalic/atraumatic. She is normal carotids. There is no JVD.  Lungs: Lungs are clear to auscultation.   Heart: Regular rate.  Soft murmur.    Abdomen: Is good bowel sounds. There is no hepatosplenomegaly.  She has a ventral hernia associated with a  surgical wound from her SBO in June,  2012.  Extremities:  No clubbing cyanosis or edema.  Neuro:  There exam is nonfocal.    ECG: Her EKG reveals normal sinus rhythm. She has a right bundle branch block. She has intermittent pacing.  Her second EKG with a magnet reveals AV pacing.  Assessment / Plan:

## 2011-12-13 NOTE — Assessment & Plan Note (Signed)
Katrina Jenkins is doing well from a cardiac standpoint. [name] not having any episodes of shortness breath chest pain. She does note some fatigue but states that she pretty bored and just does not do much. She now lives at "the [facility]". She does not find much to do there during the day. Overall think she's doing quite well given her age.

## 2012-05-06 ENCOUNTER — Ambulatory Visit (INDEPENDENT_AMBULATORY_CARE_PROVIDER_SITE_OTHER): Payer: Medicare Other | Admitting: Internal Medicine

## 2012-05-06 ENCOUNTER — Encounter: Payer: Self-pay | Admitting: Internal Medicine

## 2012-05-06 VITALS — BP 98/55 | HR 78 | Ht 68.0 in | Wt 151.2 lb

## 2012-05-06 DIAGNOSIS — I4430 Unspecified atrioventricular block: Secondary | ICD-10-CM

## 2012-05-06 DIAGNOSIS — R61 Generalized hyperhidrosis: Secondary | ICD-10-CM

## 2012-05-06 DIAGNOSIS — I428 Other cardiomyopathies: Secondary | ICD-10-CM

## 2012-05-06 DIAGNOSIS — R0602 Shortness of breath: Secondary | ICD-10-CM

## 2012-05-06 DIAGNOSIS — Z950 Presence of cardiac pacemaker: Secondary | ICD-10-CM

## 2012-05-06 LAB — PACEMAKER DEVICE OBSERVATION
AL AMPLITUDE: 2.1 mv
BAMS-0001: 150 {beats}/min
DEVICE MODEL PM: 2737768
RV LEAD AMPLITUDE: 4.2 mv
RV LEAD THRESHOLD: 0.625 V
VENTRICULAR PACING PM: 88

## 2012-05-06 NOTE — Assessment & Plan Note (Signed)
The patient's device was interrogated.  The information was reviewed. No changes were made in the programming.    

## 2012-05-06 NOTE — Patient Instructions (Addendum)
Your physician wants you to follow-up in: 6 months with device check and 1 year with Dr. Graciela Husbands. You will receive a reminder letter in the mail two months in advance. If you don't receive a letter, please call our office to schedule the follow-up appointment.  Your physician has recommended you make the following change in your medication:  -stop coreg -stop simvastatin/zocor

## 2012-05-06 NOTE — Progress Notes (Signed)
Patient Care Team: Florentina Jenny, MD as PCP - General (Family Medicine) Florentina Jenny, MD as Referring Physician (Family Medicine)   HPI  Katrina Jenkins is a 79 y.o. female Seen in followup for pacemaker implanted January 2013 for symptomatic 2:1 heart block with bradycardia as well as PVCs which we hoped to overdrive suppress .  Initially she is very glad the benefits of pacing  Now complaining of fatigue and sweats and of the   Past Medical History  Diagnosis Date  . Chronic combined systolic and diastolic heart failure     Due to nonischemic CM (?PVC related); EF 20-30% by ECHO 2/10. Mild MR (echo 9/09 35-40%); Minimal non-obstructive CAD by cath 6/09  . Hypertension   . Hyperlipidemia   . Hypothyroidism   . Asthma   . Allergic rhinitis   . Osteoarthritis   . Congenital absence of ear     Right  . Peripheral neuropathy     Unknown etiology. Wears a Fentanyl patch.  . Pacemaker     Quit long time ago  . Foot ulcer     Right  . Hernia     ventral; unrepaired  . PVC's (premature ventricular contractions)     Frequent; Previosly on Amiodarone to suppress. Limited by tremors; Discussed ablation and CRT with Dr. Graciela Husbands but decided to defer  . second degree atrioventricular block   . COPD (chronic obstructive pulmonary disease)   . Pneumonia   . Chronic bronchitis   . Shortness of breath 05/08/11    "just about all the time lately"  . Chronic lower back pain     Past Surgical History  Procedure Date  . Doppler echocardiography 04/2006    25-30%  . Tonsillectomy and adenoidectomy 1928  . Abdominal hysterectomy ~1975  . Dilation and curettage of uterus   . Cataract extraction w/ intraocular lens  implant, bilateral ~ 2007  . Joint replacement   . Replacement total knee ~ 2000    right  . Neck surgery     "don't remember what it was for; it was in the back of my neck"  . Thyroid surgery     "nodule removed years ago"  . Insert / replace / remove pacemaker 05/08/11    initial placement    Current Outpatient Prescriptions  Medication Sig Dispense Refill  . acetaminophen (TYLENOL) 325 MG tablet Takes 1-2 tablets For pain as needed      . beclomethasone (QVAR) 80 MCG/ACT inhaler Inhale 1 puff into the lungs 2 (two) times daily as needed.      . carvedilol (COREG) 3.125 MG tablet Take 3.125 mg by mouth daily.      . cholecalciferol (VITAMIN D) 1000 UNITS tablet Take 2,000 Units by mouth daily.      Marland Kitchen donepezil (ARICEPT) 5 MG tablet Take 5 mg by mouth at bedtime as needed.      . DULoxetine (CYMBALTA) 30 MG capsule Take 30 mg by mouth daily.      Marland Kitchen estradiol (ESTRACE) 0.5 MG tablet Take 0.5 mg by mouth 3 (three) times daily.      . ferrous sulfate 325 (65 FE) MG tablet Take 325 mg by mouth 2 (two) times daily.      . fluticasone (FLONASE) 50 MCG/ACT nasal spray Place 2 sprays into the nose daily.      . furosemide (LASIX) 40 MG tablet Take 40 mg by mouth daily.      Marland Kitchen levothyroxine (SYNTHROID) 100 MCG tablet Take 100  mcg by mouth daily.      Marland Kitchen lubiprostone (AMITIZA) 8 MCG capsule Take 8 mcg by mouth 2 (two) times daily with a meal.      . medroxyPROGESTERone (PROVERA) 2.5 MG tablet Take 2.5 mg by mouth daily.      . methadone (DOLOPHINE) 10 MG tablet Take 10 mg by mouth every 8 (eight) hours.      . methadone (DOLOPHINE) 5 MG tablet Take 2.5 mg by mouth 3 (three) times daily.       . Mirabegron (MYRBETRIQ PO) Take 100 mcg by mouth daily.      Marland Kitchen oxycodone (OXY-IR) 5 MG capsule Take 5 mg by mouth every 4 (four) hours as needed. For pain       . potassium chloride SA (K-DUR,KLOR-CON) 20 MEQ tablet Take 20 mEq by mouth daily.      . pregabalin (LYRICA) 100 MG capsule Take 100 mg by mouth 3 (three) times daily.       . ranitidine (ZANTAC) 150 MG tablet Take 150 mg by mouth daily.      . simvastatin (ZOCOR) 20 MG tablet Take 20 mg by mouth at bedtime.        . sodium chloride (OCEAN) 0.65 % nasal spray Place 1 spray into the nose as needed.      . traZODone  (DESYREL) 50 MG tablet Take 50 mg by mouth at bedtime.        . vitamin B-12 (CYANOCOBALAMIN) 500 MCG tablet Take 500 mcg by mouth daily.          Allergies  Allergen Reactions  . Levofloxacin     unknown     Review of Systems negative except from HPI and PMH  Physical Exam BP 98/55  Pulse 78  Ht 5\' 8"  (1.727 m)  Wt 151 lb 4 oz (68.607 kg)  BMI 23.00 kg/m2 Well developed and well nourished in no acute distress HENT normal E scleral and icterus clear Neck Supple JVP flat; carotids brisk and full Clear to ausculation  Regular rate and rhythm, no murmurs gallops or rub Soft with active bowel sounds No clubbing cyanosis no Edema Alert and oriented, grossly normal motor and sensory function Skin Warm and Dry    Assessment and  Plan

## 2012-05-06 NOTE — Assessment & Plan Note (Signed)
Stable post pacing 

## 2012-05-06 NOTE — Assessment & Plan Note (Signed)
The patient is having significant sweating as well as fatigue. I suggested she followup with her PCP to review whether these are potential manifestations of medications she is to call me she has any cardiac questions

## 2012-05-06 NOTE — Assessment & Plan Note (Signed)
Significant fatigue it may be worth repeating the echo. She is on a homeopathic dose of carvedilol I will stop it

## 2012-05-21 ENCOUNTER — Encounter: Payer: No Typology Code available for payment source | Admitting: Internal Medicine

## 2012-05-28 ENCOUNTER — Encounter: Payer: No Typology Code available for payment source | Admitting: Internal Medicine

## 2012-06-19 ENCOUNTER — Ambulatory Visit: Payer: No Typology Code available for payment source | Admitting: Cardiovascular Disease

## 2012-06-25 ENCOUNTER — Telehealth: Payer: Self-pay | Admitting: *Deleted

## 2012-06-25 ENCOUNTER — Inpatient Hospital Stay: Payer: Self-pay | Admitting: Specialist

## 2012-06-25 LAB — CBC
HCT: 38.7 % (ref 35.0–47.0)
HGB: 12.3 g/dL (ref 12.0–16.0)
MCHC: 31.9 g/dL — ABNORMAL LOW (ref 32.0–36.0)
MCV: 92 fL (ref 80–100)
RDW: 16.6 % — ABNORMAL HIGH (ref 11.5–14.5)
WBC: 6.7 10*3/uL (ref 3.6–11.0)

## 2012-06-25 LAB — URINALYSIS, COMPLETE
Glucose,UR: NEGATIVE mg/dL (ref 0–75)
Ketone: NEGATIVE
Nitrite: NEGATIVE
Protein: 30
RBC,UR: 2 /HPF (ref 0–5)
Squamous Epithelial: NONE SEEN
WBC UR: 6 /HPF (ref 0–5)

## 2012-06-25 LAB — COMPREHENSIVE METABOLIC PANEL
Albumin: 3.3 g/dL — ABNORMAL LOW (ref 3.4–5.0)
BUN: 20 mg/dL — ABNORMAL HIGH (ref 7–18)
Calcium, Total: 8.3 mg/dL — ABNORMAL LOW (ref 8.5–10.1)
Co2: 34 mmol/L — ABNORMAL HIGH (ref 21–32)
EGFR (African American): 35 — ABNORMAL LOW
EGFR (Non-African Amer.): 31 — ABNORMAL LOW
Glucose: 98 mg/dL (ref 65–99)
Osmolality: 286 (ref 275–301)
SGOT(AST): 19 U/L (ref 15–37)
Sodium: 142 mmol/L (ref 136–145)
Total Protein: 7 g/dL (ref 6.4–8.2)

## 2012-06-25 NOTE — Telephone Encounter (Signed)
It sounds like she needs to go the ER.

## 2012-06-25 NOTE — Telephone Encounter (Signed)
Spoke with Raynelle Fanning the NP at [facility] she is very concerned about the pt. She mentioned that pt is very lethargic and she has been very short of breath for the past few weeks. Bp 90/60 HR:60 She mentioned that pt had seen Graciela Husbands 1/14 and he had discontinued coreg. Pt is currently back taking coreg and has been for atleast 2 wks.  She has also has been taking Furosemide bid to help with SOB. Np is very concerned because she has never seen her this bad and feels like her health is declining. She believes pt needs to be seen by Dr. asap.  Please Advise and Contact NP Raynelle Fanning directly on cell phone:[phone].

## 2012-06-25 NOTE — Telephone Encounter (Signed)
Notified Mr. Purkey per Dr. Elease Hashimoto with the patient's condition need to have the EMS transfer her to the ER. I have also contacted Raynelle Fanning NP with the same information that Dr. Elease Hashimoto had given in regards to going to the ER.

## 2012-06-26 LAB — BASIC METABOLIC PANEL
Anion Gap: 7 (ref 7–16)
BUN: 18 mg/dL (ref 7–18)
Chloride: 110 mmol/L — ABNORMAL HIGH (ref 98–107)
Glucose: 67 mg/dL (ref 65–99)
Osmolality: 285 (ref 275–301)
Potassium: 3.8 mmol/L (ref 3.5–5.1)
Sodium: 143 mmol/L (ref 136–145)

## 2012-06-27 LAB — URINE CULTURE

## 2012-06-28 LAB — CBC WITH DIFFERENTIAL/PLATELET
Basophil #: 0.1 10*3/uL (ref 0.0–0.1)
Basophil %: 1 %
Eosinophil #: 0.1 10*3/uL (ref 0.0–0.7)
HGB: 12.8 g/dL (ref 12.0–16.0)
Lymphocyte #: 0.9 10*3/uL — ABNORMAL LOW (ref 1.0–3.6)
MCH: 29.7 pg (ref 26.0–34.0)
MCHC: 32.3 g/dL (ref 32.0–36.0)
MCV: 92 fL (ref 80–100)
Neutrophil #: 4.9 10*3/uL (ref 1.4–6.5)
Neutrophil %: 75.6 %
Platelet: 143 10*3/uL — ABNORMAL LOW (ref 150–440)
RBC: 4.32 10*6/uL (ref 3.80–5.20)

## 2012-06-28 LAB — BASIC METABOLIC PANEL
Anion Gap: 7 (ref 7–16)
Calcium, Total: 8.5 mg/dL (ref 8.5–10.1)
Chloride: 108 mmol/L — ABNORMAL HIGH (ref 98–107)
Co2: 24 mmol/L (ref 21–32)
EGFR (Non-African Amer.): 56 — ABNORMAL LOW
Osmolality: 276 (ref 275–301)
Potassium: 4.1 mmol/L (ref 3.5–5.1)
Sodium: 139 mmol/L (ref 136–145)

## 2012-06-30 LAB — CULTURE, BLOOD (SINGLE)

## 2012-07-01 LAB — CULTURE, BLOOD (SINGLE)

## 2012-07-01 LAB — EXPECTORATED SPUTUM ASSESSMENT W GRAM STAIN, RFLX TO RESP C

## 2012-07-16 DEATH — deceased

## 2012-09-18 ENCOUNTER — Ambulatory Visit: Payer: Self-pay | Admitting: Cardiovascular Disease

## 2014-03-26 ENCOUNTER — Encounter (HOSPITAL_COMMUNITY): Payer: Self-pay | Admitting: Internal Medicine

## 2014-08-07 NOTE — H&P (Signed)
PATIENT NAME:  ADEN, YOUNGMAN MR#:  [id number] DATE OF BIRTH:  15-Jul-1922  DATE OF ADMISSION:  06/25/2012  Addendum.  This is a continuation.   PAST MEDICAL HISTORY:  Hypertension, hyperlipidemia, hypothyroidism, asthma, lower extremity neuropathy, chronic pain syndrome, nonischemic cardiomyopathy with systolic congestive heart failure of 20% to 30% ejection fraction, paroxysmal atrial fibrillation, coronary artery disease, CKD, right foot osteomyelitis.   PAST SURGICAL HISTORY:   1.  Total abdominal hysterectomy with bilateral salpingo-oophorectomy.  2.  Thyroidectomy.  3.  Right total knee replacement.  4.  Right foot surgery.  5.  Multiple other foot surgeries.   ALLERGIES:  LEVAQUIN, CIPRO, TETRACYCLINE.   FAMILY HISTORY:  Her daughter has breast cancer.   SOCIAL HISTORY:  The patient lives at assisted living facility along with her husband.  Her son accompanied her to the Emergency Room.  She is a retired Runner, broadcasting/film/video.  Does not smoke.  No alcohol.  No illicit drugs.   CODE STATUS:  DO NOT RESUSCITATE/DO NOT INTUBATE.   HOME MEDICATIONS: 1.  Amitiza 8 mcg oral twice daily.  2.  Atrovent nasal spray 3 times a day.  3.  Coreg 3.125 mg oral twice a day.  4.  Cymbalta 30 mg oral once a day.  5.  Donepezil 10 mg once a day.  6.  Ferrous sulfate 325 mg twice a day.  7.  Flonase nasal spray twice a day.  8.  Lasix 40 mg oral once a day.  9.  Potassium chloride 20 mEq oral once a day.  10.  Levothyroxine 100 mcg oral once a day.  11.  Lyrica 100 mg oral 2 times a day.  12.  Methadone 10 mg oral 3 times a day.  13.  MiraLAX once a day as needed.  14.  Oxycodone 5 mg oral 3 times a day as needed.  15.  ProAir HFA 2 puffs inhaled 4 times a day.  16.  QVAR 80 mcg 2 puffs inhaled once a day.  17.  Ranitidine 150 mg oral twice a day.  18.  Trazodone 50 mg 1.5 tabs oral once a day at midnight.  19.  Vitamin B12 500 mcg oral once a day.  20.  Vitamin D3 1000 international units 2 capsules  oral once a day.  21.  Zocor 10 mg oral once a day.   PHYSICAL EXAMINATION: VITAL SIGNS:  Temperature 97, pulse of 86, blood pressure 75/40, presently at 94/69.  Saturating 96% on room air.  GENERAL:  Elderly Caucasian female patient lying in bed, comfortable, conversational, cooperative with exam.  PSYCHIATRIC:  Alert, oriented x 3.  Mood and affect appropriate.  Judgment intact.  HEENT:  Atraumatic, normocephalic.  Oral mucosa dry and pink.  External ears and nose normal.  No pallor.  No icterus.  Pupils bilaterally equal and reactive to light.  NECK:  Supple.  No thyromegaly.  No palpable lymph nodes.  Trachea midline.  No carotid bruit, JVD.  CARDIOVASCULAR:  S1, S2 regular.  Peripheral pulses 2+.  1+ edema in the lower extremities.  RESPIRATORY:  Normal work of breathing with some crackles on the right base.  Good air entry on both sides.  GASTROINTESTINAL:  Abdomen soft, nontender.  Bowel sounds present.  No hepatosplenomegaly palpable.  GENITOURINARY:  No CVA tenderness or bladder distention.  SKIN:  Warm and dry.  No petechiae, rash, ulcers.  MUSCULOSKELETAL:  No joint swelling, redness, effusion of the large joints.  Normal muscle tone.  NEUROLOGICAL:  Motor strength  5/5 in upper and lower extremities.  Sensation to fine touch intact all over.  Cranial nerves II through XII intact.  LYMPHATIC:  No cervical lymphadenopathy.   LABORATORY STUDIES:  Show glucose of 98, BUN 20, creatinine 1.52, sodium 142, potassium 3.8 with AST, ALT, alkaline phosphatase normal.  WBC 6.7, hemoglobin 12.3, platelets of 164.  Urinalysis shows 6 WBC and 3+ bacteria.    EKG shows a paced rhythm.   Chest x-ray shows chronic changes, no acute abnormalities, has cardiomegaly.  No pleural effusion or pulmonary edema.   ASSESSMENT AND PLAN: 1.  Urinary tract infection with hypotension.  The patient has received 2 liters of IV fluids, but still is borderline low blood pressure in the 90s.  We will start IV  antibiotics and admit patient.  Closely monitor for any fluid overload with her history of congestive heart failure.  Presently, patient is saturating 96% on room air.  Await cultures.  2.  Cough, likely secondary from postnasal drip or allergies.  No infiltrate on chest x-ray.  Does have asthma which could be causing the cough.  We will continue her inhalers.  DuoNeb as needed and guaifenesin.  We will repeat a chest x-ray in the morning to look for any pneumonia.  3.  Systolic congestive heart failure.  Stable.  Monitor for fluid overload.  4.  Hypertension.  We will hold medication secondary to the hypotension at this time.  5.  Paroxysmal atrial fibrillation, stable.  6.  Deep vein thrombosis prophylaxis with heparin.    TIME SPENT TODAY ON THIS CASE:  Was 60 minutes.     ____________________________ Molinda BailiffSrikar R. Sudini, MD srs:ea D: 06/25/2012 22:11:29 ET T: 06/25/2012 22:56:29 ET JOB#: [id number]  cc: Wardell HeathSrikar R. Sudini, MD, <Dictator> Drue DunHenry F. Redmond Schoolripp, MD Orie FishermanSRIKAR R SUDINI MD ELECTRONICALLY SIGNED 07/04/2012 13:19

## 2014-08-07 NOTE — H&P (Signed)
PATIENT NAME:  Katrina Jenkins, Ayomide MR#:  [id number] DATE OF BIRTH:  July 08, 1922  DATE OF ADMISSION:  06/25/2012  PRIMARY CARE PHYSICIAN:  Drue DunHenry F. Tripp, MD   CHIEF COMPLAINT:  Difficult walking, extreme fatigue and some shortness of breath.   HISTORY OF PRESENTING ILLNESS:  An 81 year old Caucasian female patient with history of hypertension, chronic systolic CHF with ejection fraction of 20 to 30%, paroxysmal atrial fibrillation and CKD who presents to the hospital complaining of worsening fatigue, difficulty walking and worsening shortness of breath. The patient at baseline tends to walk with a walker, but has been getting extremely tired and short of breath. Here in the Emergency Room, she has been found to be hypotensive with systolic blood pressure in the 70s with a urinary tract infection. Chest x-ray is showing acute infiltrate. The patient responded well to IV fluids with blood pressure going more than 100, but then her blood pressure came down into the 80s and is being admitted to the hospitalist service for UTI with significant hypotension. The patient is afebrile and WBC count is normal. Chest x-ray shows no acute infiltrate.   The patient has chronic pain on narcotic medications. She is DNR/DNI. Does not complain of any fever, chills, nausea, vomiting, abdominal pain or rash.   REVIEW OF SYSTEMS:  CONSTITUTIONAL: Complains of fatigue and weakness. No weight loss, weight gain.  EYES: No blurred vision, pain or redness.  ENT: No tinnitus, ear pain, hearing loss. Has had some cough for 1 to 2 weeks, nonproductive and dry.  RESPIRATORY: Has cough. No wheeze, hemoptysis. Complains of dyspnea on walking.  CARDIOVASCULAR: No chest pain, orthopnea, has 2+ edema of the lower extremities.  GASTROINTESTINAL: No nausea, vomiting, diarrhea, abdominal pain.  GENITOURINARY: No dysuria, hematuria, frequency.  ENDOCRINE: No polyuria or nocturia. Has hypothyroidism.  HEMATOLOGIC/LYMPHATIC: No easy  bruising, bleeding.  MUSCULOSKELETAL: Has chronic pain and arthritis.  NEUROLOGIC: No focal numbness or seizures.  PSYCHIATRIC: Has insomnia and anxiety.    DICTATION ENDS HERE   ____________________________ Molinda BailiffSrikar R. Sudini, MD srs:si D: 06/25/2012 21:39:56 ET T: 06/25/2012 22:54:52 ET JOB#: [id number]  cc: Wardell HeathSrikar R. Sudini, MD, <Dictator> Orie FishermanSRIKAR R SUDINI MD ELECTRONICALLY SIGNED 07/04/2012 13:19

## 2014-08-07 NOTE — Discharge Summary (Signed)
PATIENT NAME:  Katrina Jenkins, Cinde MR#:  [id number] DATE OF BIRTH:  09/19/1922  DATE OF ADMISSION:  06/25/2012 DATE OF DISCHARGE:  06/29/2012  For a detailed note, please take a look at the history and physical done on admission by Dr. Cherlynn KaiserSainani.   DIAGNOSES AT DISCHARGE: Is as follows:  1.  Systemic inflammatory response syndrome secondary to urinary tract infection. 2.  Urinary tract infection secondary to Enterococcus faecalis.  3.  Cardiomyopathy with ejection fraction of 25%. 4.  Asthma/chronic obstructive pulmonary disease. 5.  Congestive heart failure.  6.  Dementia.  7.  Hypothyroidism.  8.  Depression.  9.  Chronic pain syndrome.   DIET: The patient is being discharged on a low sodium, low fat diet.   ACTIVITY: As tolerated.   FOLLOWUP: With Dr. Florentina JennyHenry Tripp in the next 1 to 2 weeks.  DISCHARGE MEDICATIONS:  As follows: Coreg 3.125 mg daily, Lasix 40 mg daily, Synthroid 100 mcg daily, trazodone 50 mg 1-1/2 tabs at bedtime as needed, Cymbalta 30 mg daily, Myrbetriq 50 mg 1 tab daily, potassium 12 mEq daily, vitamin B12 500 mcg daily, vitamin D3 1000 international units 2 caps daily, Amitiza 8 mcg 1 cap b.i.d., iron sulfate 325 mg b.i.d., Qvar 2 puffs b.i.d., ranitidine 150 mg b.i.d., Lyrica 100 mg t.i.d., methadone 5 mg per mL 16 mL t.i.d., Aricept 10 mg at bedtime, latanoprost eye drops 0.005% 1 drop to each affected eye at bedtime, Zocor 10 mg daily, Atrovent 1 spray t.i.d. as needed, Flonase 2 sprays b.i.d. as needed, MiraLax daily as needed, albuterol inhaler 2 puffs q. 4 hours as needed, Senokot 1 tab b.i.d. as needed, Systane eye drops q.i.d. as needed, Vantin 200 mg b.i.d., Ceftin 250 mg x 5 days and prednisone taper starting at 50 mg, down to 10 mg over the next 5 days.   The patient is also being discharged on home oxygen at 2 L.   The patient is being discharged also with home health nursing and physical therapy services.   PERTINENT STUDIES DONE DURING THE HOSPITAL COURSE: As  follows: Chest x-ray done on admission showing mild cardiomegaly with pacemaker present. Repeat chest x-ray on 03/14 showing finding consistent with worsening congestive heart failure and pulmonary interstitial edema with bilateral pleural effusions.   HOSPITAL COURSE: This is an 81 year old female with medical problems as mentioned above, who presented to the hospital on 06/25/2012 secondary to fever and hypotension and noted to have a urinary tract infection.  1.  Systemic inflammatory response syndrome. The patient presented with fever, hypotension and tachycardia. After getting some IV fluids and IV antibiotics, the patient's clinical symptoms and hemodynamics have improved. She is currently afebrile and hemodynamically stable. Her urine cultures grew out E. coli, which is pansensitive. She was originally on ceftriaxone, but currently being discharged on p.o. Ceftin. Her blood cultures did grow out gram-positive cocci, which turned out to be coagulase-negative staph, which is a skin contaminant.  2.  Urinary tract infection. The patient was treated with IV ceftriaxone, now currently being discharged on p.o. Ceftin.  3.  Hypoxia and respiratory failure. This is likely related to congestive heart failure. The patient has a severe cardiomyopathy if  up to 25%. She was getting intravenous fluids for acute renal failure and systemic inflammatory response syndrome. These were then discontinued. She received 1 dose of IV Lasix to which she responded well, although she does require home oxygen, which is being arranged for her. She will continue her Lasix as stated along with  beta blockers.  4.  History of cardiomyopathy, ejection fraction of 25%. As mentioned, the patient was in mild CHF while in the hospital. This was likely acute on chronic systolic CHF. Her Lasix initially was held given the fact she was getting fluids and she was dehydrated, although she has been resumed back on her Lasix and her beta blockers  upon discharge. She was ambulated and did qualify for home oxygen, which is being arranged for her. She will continue her Coreg and Lasix as stated.  5.  Hyperlipidemia. The patient was maintained on simvastatin, she will resume that.  6.  Hypothyroidism. The patient was maintained on her Synthroid, she will resume that.  7.  Asthma. The patient will continue her inhaled corticosteroid along with her Flonase spray as stated.  8.  Neuropathy. The patient was maintained on her Lyrica, she will resume that.   9.  Chronic pain syndrome. The patient is on significant high dose narcotics including methadone. She was on methadone 60 mL t.i.d. along with 1.6 mL t.i.d. I discontinued the 1.6 mL at this point as she is getting really high dose narcotics and I discussed with the family that it may be advisable to taper off some of her sedative meds as it is making her quite sleepy and affecting her respiratory status. This should be further addressed by her primary care physician as an outpatient.  10. The patient is a DNI/DNR. She is being discharged with home health services as stated.   TIME SPENT: 40 minutes.    ____________________________ Rolly Pancake. Cherlynn Kaiser, MD vjs:aw D: 06/29/2012 14:10:52 ET T: 06/30/2012 09:15:38 ET JOB#: [id number]  cc: Rolly Pancake. Cherlynn Kaiser, MD, <Dictator> Drue Dun. [employer], MD [place] Siren MD ELECTRONICALLY SIGNED 07/01/2012 8:18
